# Patient Record
Sex: Female | Born: 1990 | Race: White | Hispanic: No | Marital: Married | Smoking: Former smoker
Health system: Southern US, Community
[De-identification: ages and names within clinical notes are randomized; demographics above are authoritative.]

## PROBLEM LIST (undated history)

## (undated) ENCOUNTER — Inpatient Hospital Stay (HOSPITAL_COMMUNITY): Payer: Self-pay

## (undated) DIAGNOSIS — Z8619 Personal history of other infectious and parasitic diseases: Secondary | ICD-10-CM

## (undated) DIAGNOSIS — F419 Anxiety disorder, unspecified: Secondary | ICD-10-CM

## (undated) HISTORY — DX: Personal history of other infectious and parasitic diseases: Z86.19

## (undated) HISTORY — DX: Anxiety disorder, unspecified: F41.9

## (undated) HISTORY — PX: NO PAST SURGERIES: SHX2092

---
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
## 2009-05-16 ENCOUNTER — Emergency Department (HOSPITAL_COMMUNITY): Admission: EM | Admit: 2009-05-16 | Discharge: 2009-05-16 | Payer: Self-pay | Admitting: Family Medicine

## 2011-01-05 ENCOUNTER — Inpatient Hospital Stay (INDEPENDENT_AMBULATORY_CARE_PROVIDER_SITE_OTHER)
Admission: RE | Admit: 2011-01-05 | Discharge: 2011-01-05 | Disposition: A | Discharge status: Home / Self Care | Payer: 59 | Source: Ambulatory Visit | Attending: Family Medicine | Admitting: Family Medicine

## 2011-01-05 DIAGNOSIS — L255 Unspecified contact dermatitis due to plants, except food: Secondary | ICD-10-CM

## 2011-01-14 ENCOUNTER — Ambulatory Visit: Payer: 59 | Admitting: Gynecology

## 2011-01-21 ENCOUNTER — Ambulatory Visit (INDEPENDENT_AMBULATORY_CARE_PROVIDER_SITE_OTHER): Payer: 59 | Admitting: Gynecology

## 2011-01-21 DIAGNOSIS — N898 Other specified noninflammatory disorders of vagina: Secondary | ICD-10-CM

## 2011-01-21 DIAGNOSIS — N949 Unspecified condition associated with female genital organs and menstrual cycle: Secondary | ICD-10-CM

## 2011-01-21 DIAGNOSIS — B373 Candidiasis of vulva and vagina: Secondary | ICD-10-CM

## 2011-11-23 ENCOUNTER — Emergency Department (HOSPITAL_COMMUNITY): Payer: 59

## 2011-11-23 ENCOUNTER — Emergency Department (HOSPITAL_COMMUNITY)
Admission: EM | Admit: 2011-11-23 | Discharge: 2011-11-23 | Disposition: A | Discharge status: Home / Self Care | Payer: 59 | Attending: Emergency Medicine | Admitting: Emergency Medicine

## 2011-11-23 ENCOUNTER — Encounter (HOSPITAL_COMMUNITY): Payer: Self-pay | Admitting: Emergency Medicine

## 2011-11-23 DIAGNOSIS — G8929 Other chronic pain: Secondary | ICD-10-CM

## 2011-11-23 DIAGNOSIS — R1110 Vomiting, unspecified: Secondary | ICD-10-CM | POA: Insufficient documentation

## 2011-11-23 DIAGNOSIS — R1033 Periumbilical pain: Secondary | ICD-10-CM | POA: Insufficient documentation

## 2011-11-23 LAB — URINALYSIS, ROUTINE W REFLEX MICROSCOPIC
Bilirubin Urine: NEGATIVE
Ketones, ur: NEGATIVE mg/dL
Nitrite: NEGATIVE
Specific Gravity, Urine: 1.009 (ref 1.005–1.030)
Urobilinogen, UA: 0.2 mg/dL (ref 0.0–1.0)

## 2011-11-23 LAB — CBC
HCT: 37.5 % (ref 36.0–46.0)
MCHC: 34.7 g/dL (ref 30.0–36.0)
RDW: 12.7 % (ref 11.5–15.5)

## 2011-11-23 LAB — POCT I-STAT, CHEM 8
Calcium, Ion: 1.32 mmol/L (ref 1.12–1.32)
Creatinine, Ser: 0.6 mg/dL (ref 0.50–1.10)
Glucose, Bld: 98 mg/dL (ref 70–99)
Hemoglobin: 13.6 g/dL (ref 12.0–15.0)
Sodium: 142 mEq/L (ref 135–145)
TCO2: 24 mmol/L (ref 0–100)

## 2011-11-23 LAB — DIFFERENTIAL
Basophils Absolute: 0 10*3/uL (ref 0.0–0.1)
Basophils Relative: 0 % (ref 0–1)
Monocytes Absolute: 0.5 10*3/uL (ref 0.1–1.0)
Neutro Abs: 12.5 10*3/uL — ABNORMAL HIGH (ref 1.7–7.7)

## 2011-11-23 MED ORDER — HYOSCYAMINE SULFATE 0.125 MG PO TABS
0.1250 mg | ORAL_TABLET | Freq: Once | ORAL | Status: AC
  Administered 2011-11-23: 0.125 mg via ORAL
  Filled 2011-11-23: qty 1

## 2011-11-23 MED ORDER — HYOSCYAMINE SULFATE 0.125 MG SL SUBL: 0.1250 mg | SUBLINGUAL_TABLET | SUBLINGUAL | Status: AC | PRN

## 2011-11-23 MED ORDER — IOHEXOL 300 MG/ML  SOLN
100.0000 mL | Freq: Once | INTRAMUSCULAR | Status: AC | PRN
  Administered 2011-11-23: 100 mL via INTRAVENOUS

## 2011-11-23 MED ORDER — ONDANSETRON HCL 4 MG/2ML IJ SOLN
INTRAMUSCULAR | Status: AC
  Administered 2011-11-23: 4 mg via INTRAVENOUS
  Filled 2011-11-23: qty 2

## 2011-11-23 MED ORDER — ONDANSETRON HCL 4 MG PO TABS: 4.0000 mg | ORAL_TABLET | Freq: Four times a day (QID) | ORAL | Status: AC

## 2011-11-23 MED ORDER — SODIUM CHLORIDE 0.9 % IV SOLN
Freq: Once | INTRAVENOUS | Status: AC
  Administered 2011-11-23: 19:00:00 via INTRAVENOUS

## 2011-11-23 MED ORDER — MORPHINE SULFATE 4 MG/ML IJ SOLN
INTRAMUSCULAR | Status: AC
  Administered 2011-11-23: 20:00:00
  Filled 2011-11-23: qty 1

## 2011-11-23 MED ORDER — IOHEXOL 300 MG/ML  SOLN
20.0000 mL | INTRAMUSCULAR | Status: AC
  Administered 2011-11-23 (×2): 20 mL via ORAL

## 2011-11-23 NOTE — Discharge Instructions (Signed)
YOUR CT SCAN IS COMPLETELY NORMAL TONIGHT. YOU CAN BE DISCHARGED HOME AND SHOULD FOLLOW UP WITH GASTROENTEROLOGY FOR FURTHER EVALUATION OF CHRONIC ABDOMINAL PAIN. TAKE MEDICATIONS AS PRESCRIBED. RETURN HERE WITH ANY HIGH FEVER, SEVERE PAIN, OR NEW CONCERN.  Abdominal Pain Abdominal pain can be caused by many things. Your caregiver decides the seriousness of your pain by an examination and possibly blood tests and X-rays. Many cases can be observed and treated at home. Most abdominal pain is not caused by a disease and will probably improve without treatment. However, in many cases, more time must pass before a clear cause of the pain can be found. Before that point, it may not be known if you need more testing, or if hospitalization or surgery is needed. HOME CARE INSTRUCTIONS   Do not take laxatives unless directed by your caregiver.   Take pain medicine only as directed by your caregiver.   Only take over-the-counter or prescription medicines for pain, discomfort, or fever as directed by your caregiver.   Try a clear liquid diet (broth, tea, or water) for as long as directed by your caregiver. Slowly move to a bland diet as tolerated.  SEEK IMMEDIATE MEDICAL CARE IF:   The pain does not go away.   You have a fever.   You keep throwing up (vomiting).   The pain is felt only in portions of the abdomen. Pain in the right side could possibly be appendicitis. In an adult, pain in the left lower portion of the abdomen could be colitis or diverticulitis.   You pass bloody or black tarry stools.  MAKE SURE YOU:   Understand these instructions.   Will watch your condition.   Will get help right away if you are not doing well or get worse.  Document Released: 06/04/2005 Document Revised: 08/14/2011 Document Reviewed: 04/12/2008 [facility] Patient Information 2012 [place], [place].

## 2011-11-23 NOTE — ED Notes (Signed)
Patient finished oral contrast, patient states she is nauseated and having pain again at this time.  PA notified.

## 2011-11-23 NOTE — ED Provider Notes (Signed)
History     CSN: [id number]  Arrival date & time 11/23/11  1509   First MD Initiated Contact with Patient 11/23/11 1653      Chief Complaint  Patient presents with  . Abdominal Pain  . Nausea  . Emesis    (Consider location/radiation/quality/duration/timing/severity/associated sxs/prior treatment) Patient is a 21 y.o. female presenting with abdominal pain. The history is provided by the patient.  Abdominal Pain The primary symptoms of the illness include abdominal pain and vomiting. The primary symptoms of the illness do not include fever, hematochezia, dysuria or vaginal discharge. The current episode started more than 2 days ago. The onset of the illness was gradual. The problem has been gradually worsening.  The abdominal pain is located in the periumbilical region. The abdominal pain does not radiate. The abdominal pain is relieved by nothing.  The patient has not had a change in bowel habit. Symptoms associated with the illness do not include chills. Associated symptoms comments: She reports she has had this same pain recurrently for the past 2 years, daily. The pain has been worse over the last one week. One week ago she had one day of diarrhea that resolved. The pain continued worse than her usual for the duration of the week. She had vomiting 2 days ago, then again today x 3. No fever. She reports she has not had symptoms associated with a gynecologic problem in the two years she has been experiencing the discomfort and reports normal GYN exams. She is currently menstruating that started today, normal time, that does not affect the pain of complaint.. Associated medical issues comments: There is no known family history of IBS, Crohn's, or UC.Marland Kitchen    History reviewed. No pertinent past medical history.  History reviewed. No pertinent past surgical history.  Family History  Problem Relation Age of Onset  . Hypertension Mother   . Hypertension Father   . Hypertension Brother      History  Substance Use Topics  . Smoking status: Never Smoker   . Smokeless tobacco: Not on file  . Alcohol Use: No    OB History    Grav Para Term Preterm Abortions TAB SAB Ect Mult Living                  Review of Systems  Constitutional: Negative for fever and chills.  HENT: Negative.   Respiratory: Negative.   Cardiovascular: Negative.   Gastrointestinal: Positive for vomiting and abdominal pain. Negative for hematochezia.  Genitourinary: Negative for dysuria and vaginal discharge.  Musculoskeletal: Negative.   Skin: Negative.   Neurological: Negative.     Allergies  Review of patient's allergies indicates no known allergies.  Home Medications  No current outpatient prescriptions on file.  BP 117/72  Pulse 72  Temp(Src) 98 F (36.7 C) (Oral)  Resp 18  SpO2 100%  LMP 11/23/2011  Physical Exam  Constitutional: She appears well-developed and well-nourished.  HENT:  Head: Normocephalic.  Neck: Normal range of motion. Neck supple.  Cardiovascular: Normal rate and regular rhythm.   Pulmonary/Chest: Effort normal and breath sounds normal.  Abdominal: Soft. Bowel sounds are normal. There is no rebound and no guarding.       Mild tenderness without rebound or guarding to inferior periumbilical area. No distention or swelling. BS + x 4.  Musculoskeletal: Normal range of motion.  Neurological: She is alert. No cranial nerve deficit.  Skin: Skin is warm and dry. No rash noted.  Psychiatric: She has a normal mood  and affect.    ED Course  Procedures (including critical care time) Results for orders placed during the hospital encounter of 11/23/11  CBC      Component Value Range   WBC 14.7 (*) 4.0 - 10.5 (K/uL)   RBC 4.25  3.87 - 5.11 (MIL/uL)   Hemoglobin 13.0  12.0 - 15.0 (g/dL)   HCT 16.1  09.6 - 04.5 (%)   MCV 88.2  78.0 - 100.0 (fL)   MCH 30.6  26.0 - 34.0 (pg)   MCHC 34.7  30.0 - 36.0 (g/dL)   RDW 40.9  81.1 - 91.4 (%)   Platelets 234  150 - 400  (K/uL)  DIFFERENTIAL      Component Value Range   Neutrophils Relative 85 (*) 43 - 77 (%)   Neutro Abs 12.5 (*) 1.7 - 7.7 (K/uL)   Lymphocytes Relative 10 (*) 12 - 46 (%)   Lymphs Abs 1.5  0.7 - 4.0 (K/uL)   Monocytes Relative 3  3 - 12 (%)   Monocytes Absolute 0.5  0.1 - 1.0 (K/uL)   Eosinophils Relative 1  0 - 5 (%)   Eosinophils Absolute 0.2  0.0 - 0.7 (K/uL)   Basophils Relative 0  0 - 1 (%)   Basophils Absolute 0.0  0.0 - 0.1 (K/uL)  URINALYSIS, ROUTINE W REFLEX MICROSCOPIC      Component Value Range   Color, Urine YELLOW  YELLOW    APPearance CLEAR  CLEAR    Specific Gravity, Urine 1.009  1.005 - 1.030    pH 5.5  5.0 - 8.0    Glucose, UA NEGATIVE  NEGATIVE (mg/dL)   Hgb urine dipstick MODERATE (*) NEGATIVE    Bilirubin Urine NEGATIVE  NEGATIVE    Ketones, ur NEGATIVE  NEGATIVE (mg/dL)   Protein, ur NEGATIVE  NEGATIVE (mg/dL)   Urobilinogen, UA 0.2  0.0 - 1.0 (mg/dL)   Nitrite NEGATIVE  NEGATIVE    Leukocytes, UA NEGATIVE  NEGATIVE   PREGNANCY, URINE      Component Value Range   Preg Test, Ur NEGATIVE  NEGATIVE   POCT I-STAT, CHEM 8      Component Value Range   Sodium 142  135 - 145 (mEq/L)   Potassium 3.5  3.5 - 5.1 (mEq/L)   Chloride 105  96 - 112 (mEq/L)   BUN 7  6 - 23 (mg/dL)   Creatinine, Ser 7.82  0.50 - 1.10 (mg/dL)   Glucose, Bld 98  70 - 99 (mg/dL)   Calcium, Ion 9.56  2.13 - 1.32 (mmol/L)   TCO2 24  0 - 100 (mmol/L)   Hemoglobin 13.6  12.0 - 15.0 (g/dL)   HCT 08.6  57.8 - 46.9 (%)  URINE MICROSCOPIC-ADD ON      Component Value Range   Squamous Epithelial / LPF RARE  RARE    RBC / HPF 0-2  <3 (RBC/hpf)   Ct Abdomen Pelvis W Contrast  11/23/2011  *RADIOLOGY REPORT*  Clinical Data: Abdominal pain.  Nausea and emesis  CT ABDOMEN AND PELVIS WITH CONTRAST  Technique:  Multidetector CT imaging of the abdomen and pelvis was performed following the standard protocol during bolus administration of intravenous contrast.  Contrast: OMNIPAQUE IOHEXOL 300 MG/ML  IJ SOLN  Comparison: None.  Findings: The lung bases appear clear.  No pericardial or pleural effusion.  No focal liver abnormalities.  The spleen is normal.  Normal appearance of the adrenal glands.  Gallbladder is negative.  No biliary dilatation.  The pancreas is normal.  Normal appearance of both kidneys.  No upper abdominal adenopathy.  There is no pelvic or inguinal adenopathy.  Urinary bladder is normal.  The uterus and adnexal structures are negative.  The stomach and the small bowel loops appear normal.  The appendix is identified and appears normal.  The colon appears normal.  Review of the visualized osseous structures is negative.  IMPRESSION:  1.  No acute findings identified.  Original Report Authenticated By: Rosealee Albee, M.D.      1. Abdominal pain   2. Chronic pain       MDM  Patient has been stable and comfortable. CT neg. Will refer to GI for further      Medical screening examination/treatment/procedure(s) were performed by non-physician practitioner and as supervising physician I was immediately available for consultation/collaboration. Osvaldo Human, M.D.    Rodena Medin, PA-C 11/23/11 2136  Carleene Cooper III, MD 11/25/11 1115

## 2011-11-23 NOTE — ED Notes (Signed)
Ct notified of patient finishing oral contrast.

## 2011-11-23 NOTE — ED Notes (Signed)
Pt reports abdominal pain with N/V onset Saturday.

## 2012-03-02 ENCOUNTER — Inpatient Hospital Stay (HOSPITAL_COMMUNITY): Payer: 59

## 2012-03-02 ENCOUNTER — Inpatient Hospital Stay (HOSPITAL_COMMUNITY)
Admission: AD | Admit: 2012-03-02 | Discharge: 2012-03-02 | Disposition: A | Discharge status: Home / Self Care | Payer: 59 | Source: Ambulatory Visit | Attending: Obstetrics and Gynecology | Admitting: Obstetrics and Gynecology

## 2012-03-02 ENCOUNTER — Encounter (HOSPITAL_COMMUNITY): Payer: Self-pay | Admitting: *Deleted

## 2012-03-02 DIAGNOSIS — Z1389 Encounter for screening for other disorder: Secondary | ICD-10-CM

## 2012-03-02 DIAGNOSIS — O26899 Other specified pregnancy related conditions, unspecified trimester: Secondary | ICD-10-CM

## 2012-03-02 DIAGNOSIS — O99891 Other specified diseases and conditions complicating pregnancy: Secondary | ICD-10-CM | POA: Insufficient documentation

## 2012-03-02 DIAGNOSIS — R1033 Periumbilical pain: Secondary | ICD-10-CM | POA: Insufficient documentation

## 2012-03-02 DIAGNOSIS — Z3490 Encounter for supervision of normal pregnancy, unspecified, unspecified trimester: Secondary | ICD-10-CM

## 2012-03-02 LAB — WET PREP, GENITAL
Trich, Wet Prep: NONE SEEN
Yeast Wet Prep HPF POC: NONE SEEN

## 2012-03-02 LAB — URINALYSIS, ROUTINE W REFLEX MICROSCOPIC
Ketones, ur: NEGATIVE mg/dL
Leukocytes, UA: NEGATIVE
Nitrite: NEGATIVE
Protein, ur: NEGATIVE mg/dL

## 2012-03-02 NOTE — MAU Provider Note (Signed)
History     CSN: [id number]  Arrival date and time: 03/02/12 0544   First Provider Initiated Contact with Patient 03/02/12 [phone]      No chief complaint on file.  HPI This is a 21 y.o. female at 35-[redacted] weeks gestation by LMP who presents with c/o onset of pain just under umbilicus this morning. Denies leaking or bleeding. Has never had this pain before. Has nausea but denies vomiting, diarrhea or fever. Does have some constipation.   RN Note: PT SAYS SHE WENT TO A DR OFFICE ON 5-28- DOESN'T KNOW WHICH ONE- WHO DID A UPT- TOLD HER POSTIVE. HAS MID- ABD PAIN- THAT STARTED AT 0400- LAST SEX- 6-23- NO PAIN BEFORE. DID NOT TAKE ANY MED FOR PAIN    OB History    Grav Para Term Preterm Abortions TAB SAB Ect Mult Living   1               History reviewed. No pertinent past medical history.  History reviewed. No pertinent past surgical history.  Family History  Problem Relation Age of Onset  . Hypertension Mother   . Hypertension Father   . Hypertension Brother     History  Substance Use Topics  . Smoking status: Former Games developer  . Smokeless tobacco: Not on file  . Alcohol Use: No    Allergies: No Known Allergies  Prescriptions prior to admission  Medication Sig Dispense Refill  . Multiple Vitamin (MULTIVITAMIN) tablet Take 1 tablet by mouth daily.        ROS As listed in HPI  Physical Exam   Blood pressure 111/62, pulse 82, temperature 99.3 F (37.4 C), temperature source Oral, resp. rate 18, height 5\' 3"  (1.6 m), weight 137 lb 8 oz (62.37 kg), last menstrual period 12/19/2011.  Physical Exam  Constitutional: She is oriented to person, place, and time. She appears well-developed and well-nourished. No distress.  Cardiovascular: Normal rate.   Respiratory: Effort normal.  GI: Soft. She exhibits no distension and no mass. There is tenderness. There is no rebound and no guarding.       Slightly tender under umbilicus. No blood in vault. Cervix long and closed    Genitourinary: Vagina normal and uterus normal. No vaginal discharge found.  Musculoskeletal: Normal range of motion.  Neurological: She is alert and oriented to person, place, and time.  Skin: Skin is warm and dry.  Psychiatric: She has a normal mood and affect.   Results for orders placed during the hospital encounter of 03/02/12 (from the past 24 hour(s))  POCT PREGNANCY, URINE     Status: Abnormal   Collection Time   03/02/12  6:18 AM      Component Value Range   Preg Test, Ur POSITIVE (*) NEGATIVE  URINALYSIS, ROUTINE W REFLEX MICROSCOPIC     Status: Abnormal   Collection Time   03/02/12  6:31 AM      Component Value Range   Color, Urine YELLOW  YELLOW   APPearance CLEAR  CLEAR   Specific Gravity, Urine >1.030 (*) 1.005 - 1.030   pH 6.0  5.0 - 8.0   Glucose, UA NEGATIVE  NEGATIVE mg/dL   Hgb urine dipstick NEGATIVE  NEGATIVE   Bilirubin Urine NEGATIVE  NEGATIVE   Ketones, ur NEGATIVE  NEGATIVE mg/dL   Protein, ur NEGATIVE  NEGATIVE mg/dL   Urobilinogen, UA 0.2  0.0 - 1.0 mg/dL   Nitrite NEGATIVE  NEGATIVE   Leukocytes, UA NEGATIVE  NEGATIVE  WET PREP, GENITAL  Status: Abnormal   Collection Time   03/02/12  6:52 AM      Component Value Range   Yeast Wet Prep HPF POC NONE SEEN  NONE SEEN   Trich, Wet Prep NONE SEEN  NONE SEEN   Clue Cells Wet Prep HPF POC FEW (*) NONE SEEN   WBC, Wet Prep HPF POC MODERATE (*) NONE SEEN    MAU Course  Procedures  MDM GC/Chl/Wet prep done. Will check [place] to confirm IUP.  Assessment and Plan  Report to oncoming NP  [facility] 03/02/2012, 6:54 AM   Ultrasound results show a 10 week 4 day IUP, normal ovaries, no adnexal mass  Assessment: 21 y.o. with viable IUP @ 10.[redacted] weeks gestation   Abdominal pain in pregnancy resolved prior to d/c  Plan:  Start prenatal care, pregnancy verification letter given   Return as needed.

## 2012-03-02 NOTE — MAU Note (Signed)
PT SAYS SHE WENT TO A DR OFFICE ON 5-28- DOESN'T KNOW WHICH ONE- WHO DID A UPT- TOLD HER  POSTIVE.  HAS MID- ABD PAIN- THAT STARTED AT  0400-  LAST SEX-  6-23-   NO PAIN BEFORE.  DID NOT TAKE ANY MED FOR PAIN.

## 2012-03-02 NOTE — Discharge Instructions (Signed)
________________________________________     To schedule your Maternity Eligibility Appointment, please call [phone].  When you arrive for your appointment you must bring the following items or information listed below.  Your appointment will be rescheduled if you do not have these items or are 15 minutes late. If currently receiving Medicaid, you MUST bring: 1. Medicaid Card 2. Social Security Card 3. Picture ID 4. Proof of Pregnancy 5. Verification of current address if the address on Medicaid card is incorrect "postmarked mail" If not receiving Medicaid, you MUST bring: 1. Social Security Card 2. Picture ID 3. Birth Certificate (if available) Passport or *Green Card 4. Proof of Pregnancy 5. Verification of current address "postmarked mail" for each income presented. 6. Verification of insurance coverage, if any 7. Check stubs from each employer for the previous month (if unable to present check stub  for each week, we will accept check stub for the first and last week ill the same month.) If you can't locate check stubs, you must bring a letter from the employer(s) and it must have the following information on letterhead, typed, in English: o name of company o company telephone number o how long been with the company, if less than one month o how much person earns per hour o how many hours per week work o the gross pay the person earned for the previous month If you are 22 years old or less, you do not have to bring proof of income unless you work or live with the father of the baby and at that time we will need proof of income from you and/or the father of the baby. Green Card recipients are eligible for Medicaid for Pregnant Women (MPW)    Abdominal Pain During Pregnancy Abdominal discomfort is common in pregnancy. Most of the time, it does not cause harm. There are many causes of abdominal pain. Some causes are more serious than others. Some of the causes of abdominal  pain in pregnancy are easily diagnosed. Occasionally, the diagnosis takes time to understand. Other times, the cause is not determined. Abdominal pain can be a sign that something is very wrong with the pregnancy, or the pain may have nothing to do with the pregnancy at all. For this reason, always tell your caregiver if you have any abdominal discomfort. CAUSES Common and harmless causes of abdominal pain include:  Constipation.   Excess gas and bloating.   Round ligament pain. This is pain that is felt in the folds of the groin.   The position the baby or placenta is in.   Baby kicks.   Braxton-Hicks contractions. These are mild contractions that do not cause cervical dilation.  Serious causes of abdominal pain include:  Ectopic pregnancy. This happens when a fertilized egg implants outside of the uterus.   Miscarriage.   Preterm labor. This is when labor starts at less than 37 weeks of pregnancy.   Placental abruption. This is when the placenta partially or completely separates from the uterus.   Preeclampsia. This is often associated with high blood pressure and has been referred to as "toxemia in pregnancy."   Uterine or amniotic fluid infections.  Causes unrelated to pregnancy include:  Urinary tract infection.   Gallbladder stones or inflammation.   Hepatitis or other liver illness.   Intestinal problems, stomach flu, food poisoning, or ulcer.   Appendicitis.   Kidney (renal) stones.   Kidney infection (pylonephritis).  HOME CARE INSTRUCTIONS  For mild pain:  Do not  have sexual intercourse or put anything in your vagina until your symptoms go away completely.   Get plenty of rest until your pain improves. If your pain does not improve in 1 hour, call your caregiver.   Drink clear fluids if you feel nauseous. Avoid solid food as long as you are uncomfortable or nauseous.   Only take medicine as directed by your caregiver.   Keep all follow-up appointments  with your caregiver.  SEEK IMMEDIATE MEDICAL CARE IF:  You are bleeding, leaking fluid, or passing tissue from the vagina.   You have increasing pain or cramping.   You have persistent vomiting.   You have painful or bloody urination.   You have a fever.   You notice a decrease in your baby's movements.   You have extreme weakness or feel faint.   You have shortness of breath, with or without abdominal pain.   You develop a severe headache with abdominal pain.   You have abnormal vaginal discharge with abdominal pain.   You have persistent diarrhea.   You have abdominal pain that continues even after rest, or gets worse.  MAKE SURE YOU:   Understand these instructions.   Will watch your condition.   Will get help right away if you are not doing well or get worse.  Document Released: 08/25/2005 Document Revised: 08/14/2011 Document Reviewed: 03/21/2011 [facility] Patient Information 2012 [place], [place].Abdominal Pain During Pregnancy Abdominal discomfort is common in pregnancy. Most of the time, it does not cause harm. There are many causes of abdominal pain. Some causes are more serious than others. Some of the causes of abdominal pain in pregnancy are easily diagnosed. Occasionally, the diagnosis takes time to understand. Other times, the cause is not determined. Abdominal pain can be a sign that something is very wrong with the pregnancy, or the pain may have nothing to do with the pregnancy at all. For this reason, always tell your caregiver if you have any abdominal discomfort. CAUSES Common and harmless causes of abdominal pain include:  Constipation.   Excess gas and bloating.   Round ligament pain. This is pain that is felt in the folds of the groin.   The position the baby or placenta is in.   Baby kicks.   Braxton-Hicks contractions. These are mild contractions that do not cause cervical dilation.  Serious causes of abdominal pain include:  Ectopic  pregnancy. This happens when a fertilized egg implants outside of the uterus.   Miscarriage.   Preterm labor. This is when labor starts at less than 37 weeks of pregnancy.   Placental abruption. This is when the placenta partially or completely separates from the uterus.   Preeclampsia. This is often associated with high blood pressure and has been referred to as "toxemia in pregnancy."   Uterine or amniotic fluid infections.  Causes unrelated to pregnancy include:  Urinary tract infection.   Gallbladder stones or inflammation.   Hepatitis or other liver illness.   Intestinal problems, stomach flu, food poisoning, or ulcer.   Appendicitis.   Kidney (renal) stones.   Kidney infection (pylonephritis).  HOME CARE INSTRUCTIONS  For mild pain:  Do not have sexual intercourse or put anything in your vagina until your symptoms go away completely.   Get plenty of rest until your pain improves. If your pain does not improve in 1 hour, call your caregiver.   Drink clear fluids if you feel nauseous. Avoid solid food as long as you are uncomfortable or nauseous.  Only take medicine as directed by your caregiver.   Keep all follow-up appointments with your caregiver.  SEEK IMMEDIATE MEDICAL CARE IF:  You are bleeding, leaking fluid, or passing tissue from the vagina.   You have increasing pain or cramping.   You have persistent vomiting.   You have painful or bloody urination.   You have a fever.   You notice a decrease in your baby's movements.   You have extreme weakness or feel faint.   You have shortness of breath, with or without abdominal pain.   You develop a severe headache with abdominal pain.   You have abnormal vaginal discharge with abdominal pain.   You have persistent diarrhea.   You have abdominal pain that continues even after rest, or gets worse.  MAKE SURE YOU:   Understand these instructions.   Will watch your condition.   Will get help  right away if you are not doing well or get worse.  Document Released: 08/25/2005 Document Revised: 08/14/2011 Document Reviewed: 03/21/2011 [facility] Patient Information 2012 [place], [place].

## 2012-03-03 NOTE — MAU Provider Note (Signed)
Agree with above note.  Shae Hinnenkamp 03/03/2012 8:41 AM

## 2012-03-22 ENCOUNTER — Inpatient Hospital Stay (HOSPITAL_COMMUNITY)
Admission: AD | Admit: 2012-03-22 | Discharge: 2012-03-23 | Disposition: A | Discharge status: Home / Self Care | Payer: Medicaid Other | Source: Ambulatory Visit | Attending: Obstetrics & Gynecology | Admitting: Obstetrics & Gynecology

## 2012-03-22 ENCOUNTER — Encounter (HOSPITAL_COMMUNITY): Payer: Self-pay | Admitting: *Deleted

## 2012-03-22 DIAGNOSIS — R51 Headache: Secondary | ICD-10-CM

## 2012-03-22 DIAGNOSIS — O99891 Other specified diseases and conditions complicating pregnancy: Secondary | ICD-10-CM | POA: Insufficient documentation

## 2012-03-22 DIAGNOSIS — R509 Fever, unspecified: Secondary | ICD-10-CM | POA: Insufficient documentation

## 2012-03-22 DIAGNOSIS — O26899 Other specified pregnancy related conditions, unspecified trimester: Secondary | ICD-10-CM

## 2012-03-22 LAB — URINALYSIS, ROUTINE W REFLEX MICROSCOPIC
Bilirubin Urine: NEGATIVE
Hgb urine dipstick: NEGATIVE
Ketones, ur: NEGATIVE mg/dL
Nitrite: NEGATIVE
Urobilinogen, UA: 0.2 mg/dL (ref 0.0–1.0)
pH: 6 (ref 5.0–8.0)

## 2012-03-22 MED ORDER — ONDANSETRON HCL 4 MG/2ML IJ SOLN
4.0000 mg | Freq: Once | INTRAMUSCULAR | Status: AC
  Administered 2012-03-22: 4 mg via INTRAVENOUS
  Filled 2012-03-22: qty 2

## 2012-03-22 MED ORDER — BUTALBITAL-APAP-CAFFEINE 50-325-40 MG PO TABS
2.0000 | ORAL_TABLET | Freq: Once | ORAL | Status: AC
  Administered 2012-03-22: 2 via ORAL
  Filled 2012-03-22: qty 2

## 2012-03-22 MED ORDER — LACTATED RINGERS IV BOLUS (SEPSIS)
1000.0000 mL | Freq: Once | INTRAVENOUS | Status: AC
  Administered 2012-03-22: 1000 mL via INTRAVENOUS

## 2012-03-22 MED ORDER — DIPHENHYDRAMINE HCL 50 MG/ML IJ SOLN
12.5000 mg | Freq: Once | INTRAMUSCULAR | Status: AC
  Administered 2012-03-22: 12.5 mg via INTRAVENOUS
  Filled 2012-03-22: qty 1

## 2012-03-22 NOTE — MAU Provider Note (Signed)
History     CSN: [id number]  Arrival date and time: 03/22/12 2011   First Provider Initiated Contact with Patient 03/22/12 2056      Chief Complaint  Patient presents with  . Headache   HPI This is a 21 y.o. female at [redacted]w[redacted]d who presents with headache and dizziness. Has had headaches before which usually go away with time. Never takes meds for them. This one did not go away, even though she did not take anything. Started feeling dizzy and came here. Has not eaten in 5 hours. Does not eat regularly. Takes zofran for nausea (got Rx before pregnancy) OB History    Grav Para Term Preterm Abortions TAB SAB Ect Mult Living   1               History reviewed. No pertinent past medical history.  History reviewed. No pertinent past surgical history.  Family History  Problem Relation Age of Onset  . Hypertension Mother   . Hypertension Father   . Hypertension Brother   . Other Neg Hx     History  Substance Use Topics  . Smoking status: Former Games developer  . Smokeless tobacco: Not on file  . Alcohol Use: No    Allergies: No Known Allergies  Prescriptions prior to admission  Medication Sig Dispense Refill  . Multiple Vitamin (MULTIVITAMIN) tablet Take 1 tablet by mouth daily.        ROS As in HPI  Physical Exam   Blood pressure 112/68, pulse 77, temperature 99.4 F (37.4 C), temperature source Oral, resp. rate 18, height 5' 3.5" (1.613 m), weight 134 lb (60.782 kg), last menstrual period 12/19/2011, SpO2 100.00%.  Physical Exam  Constitutional: She is oriented to person, place, and time. She appears well-developed and well-nourished. No distress.  HENT:  Head: Normocephalic.  Cardiovascular: Normal rate.   Respiratory: Effort normal.  GI: Soft. She exhibits no distension. There is no tenderness.  Musculoskeletal: Normal range of motion.  Neurological: She is alert and oriented to person, place, and time. No cranial nerve deficit. She exhibits normal muscle tone.  Coordination normal.  Skin: Skin is warm and dry.  Psychiatric: She has a normal mood and affect.   Neuro: grossly intact  MAU Course  Procedures  MDM Will give fioricet for headache. Encouraged hydration and regular food intake, at least every 4 hours. Headaches may be related to low blood sugar.  Assessment and Plan    Shanae Luo 03/22/2012, 11:56 PM  21:30 pm Assumed care from Wynelle Bourgeois, CNM @ 21:00 Patient continues to have headache after Fioricet. Rates her pain as 7/10. The pain is located in her forehead and the back of her head. It is a throbbing pain. It is worse with lights and noise.  Re examined patient. She is alert and oriented x 3 and in no acute distress. She is currently texting on her phone. Pupils are equal and react to light. No nystagmus. Good occular movement and peripheral vision. Good grips and equal bilateral. Steady gait. Pulses equal and strong upper and lower extremities, reflexes 3+ and equal. Rapid alternating movements without difficulty. Negative Romberg.   Results for orders placed during the hospital encounter of 03/22/12 (from the past 24 hour(s))  URINALYSIS, ROUTINE W REFLEX MICROSCOPIC     Status: Abnormal   Collection Time   03/22/12  8:20 PM      Component Value Range   Color, Urine YELLOW  YELLOW   APPearance CLEAR  CLEAR   Specific  Gravity, Urine >1.030 (*) 1.005 - 1.030   pH 6.0  5.0 - 8.0   Glucose, UA NEGATIVE  NEGATIVE mg/dL   Hgb urine dipstick NEGATIVE  NEGATIVE   Bilirubin Urine NEGATIVE  NEGATIVE   Ketones, ur NEGATIVE  NEGATIVE mg/dL   Protein, ur NEGATIVE  NEGATIVE mg/dL   Urobilinogen, UA 0.2  0.0 - 1.0 mg/dL   Nitrite NEGATIVE  NEGATIVE   Leukocytes, UA NEGATIVE  NEGATIVE     BP 112/68  Pulse 77  Temp 99.4 F (37.4 C) (Oral)  Resp 18  Ht 5' 3.5" (1.613 m)  Wt 134 lb (60.782 kg)  BMI 23.36 kg/m2  SpO2 100%  LMP 12/19/2011 she is not orthostatic  Assessment: Headache in pregnancy  Plan:  IV hydration, Zofran 4  mg IV, Benadryl 12.5 mg IV  23:50 pm Patient feeling better, headache 4/10. States she feels like she can go home and sleep. Husband at bedside. Patient eating and drinking without difficulty.   She will continue Zofran at home, take tylenol as needed for headache and start her prenatal care. She will return here as needed.  I have reviewed this patient's vital signs, nurses notes and appropriate labs. I have discussed plan of care with the patient and she voices understanding.

## 2012-03-22 NOTE — MAU Note (Signed)
Pt reports frequent headaches since positive preg test, has not been evaluated for these. States today the headache will not go away, has not taken meds for pain. Nausea today. LMP 12/19/2011

## 2012-03-22 NOTE — MAU Note (Signed)
Pt reports a h/a since today. Pt also reports feeling nauseated and lightheaded.

## 2012-04-23 LAB — OB RESULTS CONSOLE ANTIBODY SCREEN: Antibody Screen: NEGATIVE

## 2012-04-23 LAB — OB RESULTS CONSOLE RPR: RPR: NONREACTIVE

## 2012-04-23 LAB — OB RESULTS CONSOLE ABO/RH: RH Type: POSITIVE

## 2012-06-16 ENCOUNTER — Encounter (HOSPITAL_COMMUNITY): Payer: Self-pay

## 2012-06-16 ENCOUNTER — Inpatient Hospital Stay (HOSPITAL_COMMUNITY)
Admission: AD | Admit: 2012-06-16 | Discharge: 2012-06-17 | Disposition: A | Discharge status: Home / Self Care | Payer: Medicaid Other | Source: Ambulatory Visit | Attending: Obstetrics & Gynecology | Admitting: Obstetrics & Gynecology

## 2012-06-16 DIAGNOSIS — O368190 Decreased fetal movements, unspecified trimester, not applicable or unspecified: Secondary | ICD-10-CM | POA: Insufficient documentation

## 2012-06-16 LAB — URINALYSIS, ROUTINE W REFLEX MICROSCOPIC
Glucose, UA: NEGATIVE mg/dL
Hgb urine dipstick: NEGATIVE
Leukocytes, UA: NEGATIVE
Protein, ur: NEGATIVE mg/dL
Specific Gravity, Urine: 1.015 (ref 1.005–1.030)
pH: 7 (ref 5.0–8.0)

## 2012-06-16 NOTE — Discharge Instructions (Signed)
Fetal Movement Counts Patient Name: __________________________________________________ Patient Due Date: ____________________ Kick counts is highly recommended in high risk pregnancies, but it is a good idea for every pregnant woman to do. Start counting fetal movements at 28 weeks of the pregnancy. Fetal movements increase after eating a full meal or eating or drinking something sweet (the blood sugar is higher). It is also important to drink plenty of fluids (well hydrated) before doing the count. Lie on your left side because it helps with the circulation or you can sit in a comfortable chair with your arms over your belly (abdomen) with no distractions around you. DOING THE COUNT  Try to do the count the same time of day each time you do it.  Mark the day and time, then see how long it takes for you to feel 10 movements (kicks, flutters, swishes, rolls). You should have at least 10 movements within 2 hours. You will most likely feel 10 movements in much less than 2 hours. If you do not, wait an hour and count again. After a couple of days you will see a pattern.  What you are looking for is a change in the pattern or not enough counts in 2 hours. Is it taking longer in time to reach 10 movements? SEEK MEDICAL CARE IF:  You feel less than 10 counts in 2 hours. Tried twice.  No movement in one hour.  The pattern is changing or taking longer each day to reach 10 counts in 2 hours.  You feel the baby is not moving as it usually does. Date: ____________ Movements: ____________ Start time: ____________ Finish time: ____________  Date: ____________ Movements: ____________ Start time: ____________ Finish time: ____________ Date: ____________ Movements: ____________ Start time: ____________ Finish time: ____________ Date: ____________ Movements: ____________ Start time: ____________ Finish time: ____________ Date: ____________ Movements: ____________ Start time: ____________ Finish time:  ____________ Date: ____________ Movements: ____________ Start time: ____________ Finish time: ____________ Date: ____________ Movements: ____________ Start time: ____________ Finish time: ____________ Date: ____________ Movements: ____________ Start time: ____________ Finish time: ____________  Date: ____________ Movements: ____________ Start time: ____________ Finish time: ____________ Date: ____________ Movements: ____________ Start time: ____________ Finish time: ____________ Date: ____________ Movements: ____________ Start time: ____________ Finish time: ____________ Date: ____________ Movements: ____________ Start time: ____________ Finish time: ____________ Date: ____________ Movements: ____________ Start time: ____________ Finish time: ____________ Date: ____________ Movements: ____________ Start time: ____________ Finish time: ____________ Date: ____________ Movements: ____________ Start time: ____________ Finish time: ____________  Date: ____________ Movements: ____________ Start time: ____________ Finish time: ____________ Date: ____________ Movements: ____________ Start time: ____________ Finish time: ____________ Date: ____________ Movements: ____________ Start time: ____________ Finish time: ____________ Date: ____________ Movements: ____________ Start time: ____________ Finish time: ____________ Date: ____________ Movements: ____________ Start time: ____________ Finish time: ____________ Date: ____________ Movements: ____________ Start time: ____________ Finish time: ____________ Date: ____________ Movements: ____________ Start time: ____________ Finish time: ____________  Date: ____________ Movements: ____________ Start time: ____________ Finish time: ____________ Date: ____________ Movements: ____________ Start time: ____________ Finish time: ____________ Date: ____________ Movements: ____________ Start time: ____________ Finish time: ____________ Date: ____________ Movements:  ____________ Start time: ____________ Finish time: ____________ Date: ____________ Movements: ____________ Start time: ____________ Finish time: ____________ Date: ____________ Movements: ____________ Start time: ____________ Finish time: ____________ Date: ____________ Movements: ____________ Start time: ____________ Finish time: ____________  Date: ____________ Movements: ____________ Start time: ____________ Finish time: ____________ Date: ____________ Movements: ____________ Start time: ____________ Finish time: ____________ Date: ____________ Movements: ____________ Start time: ____________ Finish time: ____________ Date: ____________ Movements:   ____________ Start time: ____________ Finish time: ____________ Date: ____________ Movements: ____________ Start time: ____________ Finish time: ____________ Date: ____________ Movements: ____________ Start time: ____________ Finish time: ____________ Date: ____________ Movements: ____________ Start time: ____________ Finish time: ____________  Date: ____________ Movements: ____________ Start time: ____________ Finish time: ____________ Date: ____________ Movements: ____________ Start time: ____________ Finish time: ____________ Date: ____________ Movements: ____________ Start time: ____________ Finish time: ____________ Date: ____________ Movements: ____________ Start time: ____________ Finish time: ____________ Date: ____________ Movements: ____________ Start time: ____________ Finish time: ____________ Date: ____________ Movements: ____________ Start time: ____________ Finish time: ____________ Date: ____________ Movements: ____________ Start time: ____________ Finish time: ____________  Date: ____________ Movements: ____________ Start time: ____________ Finish time: ____________ Date: ____________ Movements: ____________ Start time: ____________ Finish time: ____________ Date: ____________ Movements: ____________ Start time: ____________ Finish  time: ____________ Date: ____________ Movements: ____________ Start time: ____________ Finish time: ____________ Date: ____________ Movements: ____________ Start time: ____________ Finish time: ____________ Date: ____________ Movements: ____________ Start time: ____________ Finish time: ____________ Date: ____________ Movements: ____________ Start time: ____________ Finish time: ____________  Date: ____________ Movements: ____________ Start time: ____________ Finish time: ____________ Date: ____________ Movements: ____________ Start time: ____________ Finish time: ____________ Date: ____________ Movements: ____________ Start time: ____________ Finish time: ____________ Date: ____________ Movements: ____________ Start time: ____________ Finish time: ____________ Date: ____________ Movements: ____________ Start time: ____________ Finish time: ____________ Date: ____________ Movements: ____________ Start time: ____________ Finish time: ____________ Document Released: 09/24/2006 Document Revised: 11/17/2011 Document Reviewed: 03/27/2009 [employer] Patient Information 2013 [employer].  

## 2012-06-16 NOTE — MAU Provider Note (Signed)
  History     CSN: [id number]  Arrival date and time: 06/16/12 2229   First Provider Initiated Contact with Patient 06/16/12 2311      Chief Complaint  Patient presents with  . Decreased Fetal Movement   HPI Janet Love is 21 y.o. G1P0 [redacted]w[redacted]d weeks presenting with report of decreased fetal movement since yesterday afternoon.  Patient of [facility]  Denies vaginal bleeding or loss of fluid.  Reports intermittent upper/rib pain since yesterday.  Hasn't taken anything for it.  Patient is alert and oriented and in no acute distress.      Past Medical History  Diagnosis Date  . No pertinent past medical history     Past Surgical History  Procedure Date  . No past surgeries     Family History  Problem Relation Age of Onset  . Hypertension Mother   . Hypertension Father   . Hypertension Brother   . Other Neg Hx     History  Substance Use Topics  . Smoking status: Former Games developer  . Smokeless tobacco: Not on file  . Alcohol Use: No    Allergies: No Known Allergies  Prescriptions prior to admission  Medication Sig Dispense Refill  . Multiple Vitamin (MULTIVITAMIN) tablet Take 1 tablet by mouth daily.        Review of Systems  Constitutional: Negative.   Gastrointestinal: Positive for abdominal pain (upper abdominal pain in her ribs).  Genitourinary:       Decreased fetal movement.   Physical Exam   Blood pressure 119/78, pulse 97, temperature 98.1 F (36.7 C), temperature source Oral, resp. rate 20, height 5' 2.5" (1.588 m), weight 72.576 kg (160 lb), last menstrual period 12/19/2011, SpO2 100.00%.  Physical Exam  Constitutional: She is oriented to person, place, and time. She appears well-developed and well-nourished. No distress.  Neurological: She is alert and oriented to person, place, and time.  Skin: Skin is warm and dry.  Psychiatric: She has a normal mood and affect. Her behavior is normal.   Results for orders placed during the hospital encounter  of 06/16/12 (from the past 24 hour(s))  URINALYSIS, ROUTINE W REFLEX MICROSCOPIC     Status: Normal   Collection Time   06/16/12 10:45 PM      Component Value Range   Color, Urine YELLOW  YELLOW   APPearance CLEAR  CLEAR   Specific Gravity, Urine 1.015  1.005 - 1.030   pH 7.0  5.0 - 8.0   Glucose, UA NEGATIVE  NEGATIVE mg/dL   Hgb urine dipstick NEGATIVE  NEGATIVE   Bilirubin Urine NEGATIVE  NEGATIVE   Ketones, ur NEGATIVE  NEGATIVE mg/dL   Protein, ur NEGATIVE  NEGATIVE mg/dL   Urobilinogen, UA 0.2  0.0 - 1.0 mg/dL   Nitrite NEGATIVE  NEGATIVE   Leukocytes, UA NEGATIVE  NEGATIVE     MAU Course  Procedures  MDM FMS reassuring.  Baseline rate is 140, moderate variability, 10X10, no contractions.  Felt baby move X 3 in 20 minutes 11:45  Reported MSE, UA and FMS findings to Dr. Arlyce Dice.  Order given to discharge to home and follow up as planned.  Assessment and Plan  A:  Decreased fetal movment       Viable intrauterine pregnancy at [redacted]w[redacted]d gestation  P:  Reassured      Keep scheduled appointment  Janet Love,EVE M 06/16/2012, 11:13 PM

## 2012-06-16 NOTE — MAU Note (Signed)
Pt states she has not felt the baby move all day-since 4pm yesterday

## 2012-07-07 ENCOUNTER — Inpatient Hospital Stay (HOSPITAL_COMMUNITY)
Admission: AD | Admit: 2012-07-07 | Discharge: 2012-07-07 | Disposition: A | Discharge status: Home / Self Care | Payer: Medicaid Other | Source: Ambulatory Visit | Attending: Obstetrics and Gynecology | Admitting: Obstetrics and Gynecology

## 2012-07-07 ENCOUNTER — Encounter (HOSPITAL_COMMUNITY): Payer: Self-pay | Admitting: *Deleted

## 2012-07-07 DIAGNOSIS — O4700 False labor before 37 completed weeks of gestation, unspecified trimester: Secondary | ICD-10-CM | POA: Insufficient documentation

## 2012-07-07 LAB — URINALYSIS, ROUTINE W REFLEX MICROSCOPIC
Glucose, UA: NEGATIVE mg/dL
Ketones, ur: NEGATIVE mg/dL
Leukocytes, UA: NEGATIVE
Nitrite: NEGATIVE
pH: 7 (ref 5.0–8.0)

## 2012-07-07 LAB — FETAL FIBRONECTIN: Fetal Fibronectin: NEGATIVE

## 2012-07-07 NOTE — MAU Provider Note (Signed)
History     CSN: [id number]  Arrival date and time: 07/07/12 1644   First Provider Initiated Contact with Patient 07/07/12 1755      Chief Complaint  Patient presents with  . Labor Eval   HPI Janet Love 21 y.o. [redacted]w[redacted]d Comes to MAU as she has been having contractions since 10:30 am yesterday.  Contractions are irregular at 4-20 minutes.  Feels them as tightening in her abdomen.  Was worried.  Slept last night but noticed contractions on awakening today.  OB History    Grav Para Term Preterm Abortions TAB SAB Ect Mult Living   1               Past Medical History  Diagnosis Date  . No pertinent past medical history     Past Surgical History  Procedure Date  . No past surgeries     Family History  Problem Relation Age of Onset  . Hypertension Mother   . Hypertension Father   . Hypertension Brother   . Other Neg Hx     History  Substance Use Topics  . Smoking status: Former Games developer  . Smokeless tobacco: Not on file  . Alcohol Use: No    Allergies: No Known Allergies  Prescriptions prior to admission  Medication Sig Dispense Refill  . Prenatal Vit-Fe Fumarate-FA (PRENATAL MULTIVITAMIN) TABS Take 1 tablet by mouth every morning.        Review of Systems  Constitutional: Negative for fever.  Gastrointestinal: Negative for nausea and vomiting.       Contractions  Genitourinary:       No vaginal discharge. No vaginal bleeding. No dysuria.   Physical Exam   Blood pressure 105/74, pulse 101, temperature 98.8 F (37.1 C), temperature source Oral, resp. rate 16, height 5' 3.5" (1.613 m), weight 74.299 kg (163 lb 12.8 oz), last menstrual period 12/19/2011, SpO2 100.00%.  Physical Exam  Nursing note and vitals reviewed. Constitutional: She is oriented to person, place, and time. She appears well-developed and well-nourished.  HENT:  Head: Normocephalic.  Eyes: EOM are normal.  Neck: Neck supple.  GI: Soft. There is no tenderness. There is no rebound and  no guarding.       Uterine irritability noted on monitor strip.  No strong contractions seen on strip or palpated.  FHT baseline is 140.  10x10 accels noted - reassuring strip.  Genitourinary:       Speculum exam: Vulva - labia minora slightly pink Vagina - Minimal amount of creamy discharge, no odor Cervix - No contact bleeding Bimanual exam: Cervix internal os closed, thick, vtx at -3 FFN done Chaperone present for exam.  Musculoskeletal: Normal range of motion.  Neurological: She is alert and oriented to person, place, and time.  Skin: Skin is warm and dry.  Psychiatric: She has a normal mood and affect.    MAU Course  Procedures Results for orders placed during the hospital encounter of 07/07/12 (from the past 24 hour(s))  URINALYSIS, ROUTINE W REFLEX MICROSCOPIC     Status: Normal   Collection Time   07/07/12  5:17 PM      Component Value Range   Color, Urine YELLOW  YELLOW   APPearance CLEAR  CLEAR   Specific Gravity, Urine 1.020  1.005 - 1.030   pH 7.0  5.0 - 8.0   Glucose, UA NEGATIVE  NEGATIVE mg/dL   Hgb urine dipstick NEGATIVE  NEGATIVE   Bilirubin Urine NEGATIVE  NEGATIVE   Ketones, ur  NEGATIVE  NEGATIVE mg/dL   Protein, ur NEGATIVE  NEGATIVE mg/dL   Urobilinogen, UA 0.2  0.0 - 1.0 mg/dL   Nitrite NEGATIVE  NEGATIVE   Leukocytes, UA NEGATIVE  NEGATIVE  FETAL FIBRONECTIN     Status: Normal   Collection Time   07/07/12  6:10 PM      Component Value Range   Fetal Fibronectin NEGATIVE  NEGATIVE   MDM 1810  Consult with Dr. Claiborne Billings re: plan of care 1950  Reviewed client's lab results and current condition - contractions are less frequent according to client; none showing on fetal monitor strip.  Will discharge.  Assessment and Plan  Threat of preterm labor at 28 weeks  Plan Discharge home  Keep appointments in the office as scheduled - 07-22-12. Call your doctor if your contractions are worsening.  At this time, your lab tests are  reassuring.  BURLESON,TERRI 07/07/2012, 6:11 PM

## 2012-07-07 NOTE — MAU Note (Signed)
Patient states she has been having contractions every few minutes since 10-29 am. Denies any bleeding or leaking and reports good fetal movement.

## 2012-07-07 NOTE — MAU Note (Signed)
Pt presents for contractions that started yesterday morning around 1030, and are now occurring every couple of minutes.  Denies any LOF or bleeding.  Reports good fetal movement.

## 2012-09-08 NOTE — L&D Delivery Note (Signed)
Patient was C/C/+2 and pushed for 50 minutes with epidural.   NSVD  female infant, Apgars 8,9, weight P.   The patient had one midline episiotomy repaired with 2-0 vicryl R. Fundus was firm. EBL was expected. Placenta was delivered intact. Vagina was clear.  Baby was vigorous to bedside.  Jaquanna Ballentine A

## 2012-09-16 ENCOUNTER — Inpatient Hospital Stay (HOSPITAL_COMMUNITY)
Admission: AD | Admit: 2012-09-16 | Discharge: 2012-09-16 | Disposition: A | Discharge status: Home / Self Care | Payer: Medicaid Other | Source: Ambulatory Visit | Attending: Obstetrics and Gynecology | Admitting: Obstetrics and Gynecology

## 2012-09-16 ENCOUNTER — Encounter (HOSPITAL_COMMUNITY): Payer: Self-pay

## 2012-09-16 DIAGNOSIS — O479 False labor, unspecified: Secondary | ICD-10-CM | POA: Insufficient documentation

## 2012-09-16 NOTE — MAU Note (Signed)
Patient states she is having contractions every 3 minutes. Denies bleeding or leaking and reports good fetal movement.  

## 2012-09-17 ENCOUNTER — Encounter (HOSPITAL_COMMUNITY): Payer: Self-pay | Admitting: *Deleted

## 2012-09-17 ENCOUNTER — Inpatient Hospital Stay (HOSPITAL_COMMUNITY)
Admission: AD | Admit: 2012-09-17 | Discharge: 2012-09-17 | Disposition: A | Discharge status: Home / Self Care | Payer: Medicaid Other | Source: Ambulatory Visit | Attending: Obstetrics and Gynecology | Admitting: Obstetrics and Gynecology

## 2012-09-17 DIAGNOSIS — O479 False labor, unspecified: Secondary | ICD-10-CM | POA: Insufficient documentation

## 2012-09-17 MED ORDER — ZOLPIDEM TARTRATE 5 MG PO TABS: 5.0000 mg | ORAL_TABLET | Freq: Once | ORAL | Status: DC

## 2012-09-17 NOTE — MAU Note (Signed)
Pt states contractions are closer and harder

## 2012-09-17 NOTE — MAU Note (Signed)
Pt refused ambien 5mg -states she can sleep w/o it

## 2012-09-21 ENCOUNTER — Inpatient Hospital Stay (HOSPITAL_COMMUNITY)
Admission: AD | Admit: 2012-09-21 | Discharge: 2012-09-22 | Disposition: A | Discharge status: Home / Self Care | Payer: Medicaid Other | Source: Ambulatory Visit | Attending: Obstetrics & Gynecology | Admitting: Obstetrics & Gynecology

## 2012-09-21 ENCOUNTER — Encounter (HOSPITAL_COMMUNITY): Payer: Self-pay | Admitting: *Deleted

## 2012-09-21 DIAGNOSIS — R110 Nausea: Secondary | ICD-10-CM

## 2012-09-21 DIAGNOSIS — O212 Late vomiting of pregnancy: Secondary | ICD-10-CM

## 2012-09-21 DIAGNOSIS — O479 False labor, unspecified: Secondary | ICD-10-CM

## 2012-09-21 NOTE — MAU Note (Signed)
Pt states she has been sick," crampy with really bad headaches and my back has been hurting really bad all day"

## 2012-09-21 NOTE — MAU Note (Signed)
PT  FEELS SOME TIGHTENING- AND HAS BACKACHE ALL DAY.   FEELS  NAUSEA- STARTED THIS AM- NO VOMITING .  HUSBAND HAS HAD  HEAD CONGESTION- NO V/D.   HAD DIARRHEA - LOOSE STOOLS.    VE IN OFFICE - ON Friday-  3 CM, THEN HERE Friday NIGHT   3-4 CM.   DENIES HSV AND MRSA.

## 2012-09-22 LAB — URINE MICROSCOPIC-ADD ON

## 2012-09-22 LAB — URINALYSIS, ROUTINE W REFLEX MICROSCOPIC
Bilirubin Urine: NEGATIVE
Hgb urine dipstick: NEGATIVE
Nitrite: NEGATIVE
Protein, ur: NEGATIVE mg/dL
Specific Gravity, Urine: 1.01 (ref 1.005–1.030)
Urobilinogen, UA: 0.2 mg/dL (ref 0.0–1.0)

## 2012-09-22 NOTE — MAU Provider Note (Signed)
History     CSN: [id number]  Arrival date and time: 09/21/12 2217   First Provider Initiated Contact with Patient 09/22/12 0000      Chief Complaint  Patient presents with  . Nausea   HPI Janet Love 21 y.o. [redacted]w[redacted]d Comes to MAU tonight as she has been feeling bad all day.  Has had some abdominal tightening today and had one soft, loose stool this morning.  Has had nausea and back pain almost all day.  Denies any vomiting.   OB History    Grav Para Term Preterm Abortions TAB SAB Ect Mult Living   1 0 0 0 0 0 0 0 0 0       Past Medical History  Diagnosis Date  . No pertinent past medical history     Past Surgical History  Procedure Date  . No past surgeries     Family History  Problem Relation Age of Onset  . Hypertension Mother   . Hypertension Father   . Hypertension Brother   . Other Neg Hx     History  Substance Use Topics  . Smoking status: Former Games developer  . Smokeless tobacco: Never Used  . Alcohol Use: No    Allergies: No Known Allergies  Prescriptions prior to admission  Medication Sig Dispense Refill  . Prenatal Vit-Fe Fumarate-FA (PRENATAL MULTIVITAMIN) TABS Take 1 tablet by mouth every morning.        Review of Systems  Constitutional: Negative for fever.  Gastrointestinal: Positive for nausea, abdominal pain and diarrhea. Negative for vomiting and constipation.  Genitourinary:       No vaginal discharge. No vaginal bleeding. No dysuria.  Musculoskeletal: Positive for back pain.   Physical Exam   Blood pressure 103/63, pulse 103, temperature 98.3 F (36.8 C), temperature source Oral, resp. rate 20, height 5\' 3"  (1.6 m), weight 82.611 kg (182 lb 2 oz), last menstrual period 12/19/2011.  Physical Exam  Nursing note and vitals reviewed. Constitutional: She is oriented to person, place, and time. She appears well-developed and well-nourished. No distress.  HENT:  Head: Normocephalic.  Eyes: EOM are normal.  Neck: Neck supple.  GI:  Soft. There is tenderness. There is no rebound and no guarding.       Has diffuse tenderness near umbilicus and across top of fundus.  Especially tender over left lower rib cage. On Fetal Monitor - irregular contractions.  FHT baseline 125.  Reactive strip.  Genitourinary:       RN performed  Cervical exam - 3 cm.  Unchanged from exam in office.  Musculoskeletal: Normal range of motion.  Neurological: She is alert and oriented to person, place, and time.  Skin: Skin is warm and dry.  Psychiatric: She has a normal mood and affect.    MAU Course  Procedures Results for orders placed during the hospital encounter of 09/21/12 (from the past 24 hour(s))  URINALYSIS, ROUTINE W REFLEX MICROSCOPIC     Status: Abnormal   Collection Time   09/21/12 10:30 PM      Component Value Range   Color, Urine YELLOW  YELLOW   APPearance CLEAR  CLEAR   Specific Gravity, Urine 1.010  1.005 - 1.030   pH 7.0  5.0 - 8.0   Glucose, UA NEGATIVE  NEGATIVE mg/dL   Hgb urine dipstick NEGATIVE  NEGATIVE   Bilirubin Urine NEGATIVE  NEGATIVE   Ketones, ur NEGATIVE  NEGATIVE mg/dL   Protein, ur NEGATIVE  NEGATIVE mg/dL   Urobilinogen, UA 0.2  0.0 - 1.0 mg/dL   Nitrite NEGATIVE  NEGATIVE   Leukocytes, UA TRACE (*) NEGATIVE  URINE MICROSCOPIC-ADD ON     Status: Abnormal   Collection Time   09/21/12 10:30 PM      Component Value Range   Squamous Epithelial / LPF FEW (*) RARE   WBC, UA 0-2  <3 WBC/hpf   RBC / HPF 0-2  <3 RBC/hpf   Bacteria, UA FEW (*) RARE   Urine-Other AMORPHOUS URATES/PHOSPHATES     MDM Discussed diet with client - eating chicken nuggets and fries and [place] food today.  Advised no fried foods to decrease nausea.  Advised nausea may be a normal pregnancy symptom in late pregnancy.  Reviewed more bland, nonfat diet choices. 0030  Consult with Dr. Arlyce Dice re: plan of care  Assessment and Plan  False labor Nausea  Plan Keep appointments in the office. Call your doctor if your symptoms  worsen. Drink at least 8 8-oz glasses of water every day. Take Tylenol 325 mg 2 tablets by mouth every 4 hours if needed for pain. Return if you are having regular contractions or if your water has broken. [name] 09/22/2012, 12:26 AM

## 2012-09-28 ENCOUNTER — Encounter (HOSPITAL_COMMUNITY): Payer: Self-pay | Admitting: *Deleted

## 2012-09-28 ENCOUNTER — Telehealth (HOSPITAL_COMMUNITY): Payer: Self-pay | Admitting: *Deleted

## 2012-09-28 NOTE — Telephone Encounter (Signed)
Preadmission screen  

## 2012-09-30 ENCOUNTER — Encounter (HOSPITAL_COMMUNITY): Payer: Self-pay

## 2012-09-30 ENCOUNTER — Encounter (HOSPITAL_COMMUNITY): Payer: Self-pay | Admitting: Anesthesiology

## 2012-09-30 ENCOUNTER — Inpatient Hospital Stay (HOSPITAL_COMMUNITY)
Admission: RE | Admit: 2012-09-30 | Discharge: 2012-10-02 | DRG: 775 | Disposition: A | Discharge status: Home / Self Care | Payer: Medicaid Other | Source: Ambulatory Visit | Attending: Obstetrics and Gynecology | Admitting: Obstetrics and Gynecology

## 2012-09-30 ENCOUNTER — Inpatient Hospital Stay (HOSPITAL_COMMUNITY): Payer: Medicaid Other | Admitting: Anesthesiology

## 2012-09-30 DIAGNOSIS — O480 Post-term pregnancy: Principal | ICD-10-CM | POA: Diagnosis present

## 2012-09-30 LAB — ABO/RH: ABO/RH(D): A POS

## 2012-09-30 LAB — RPR: RPR Ser Ql: NONREACTIVE

## 2012-09-30 LAB — CBC
Platelets: 185 10*3/uL (ref 150–400)
RDW: 15.3 % (ref 11.5–15.5)
WBC: 13.4 10*3/uL — ABNORMAL HIGH (ref 4.0–10.5)

## 2012-09-30 MED ORDER — DIPHENHYDRAMINE HCL 50 MG/ML IJ SOLN
12.5000 mg | INTRAMUSCULAR | Status: AC | PRN
  Administered 2012-09-30 (×3): 12.5 mg via INTRAVENOUS
  Filled 2012-09-30 (×2): qty 1

## 2012-09-30 MED ORDER — SIMETHICONE 80 MG PO CHEW: 80.0000 mg | CHEWABLE_TABLET | ORAL | Status: DC | PRN

## 2012-09-30 MED ORDER — LACTATED RINGERS IV SOLN
INTRAVENOUS | Status: DC
  Administered 2012-09-30: 11:00:00 via INTRAVENOUS
  Administered 2012-09-30: 500 mL via INTRAVENOUS
  Administered 2012-09-30 (×2): via INTRAVENOUS

## 2012-09-30 MED ORDER — CITRIC ACID-SODIUM CITRATE 334-500 MG/5ML PO SOLN: 30.0000 mL | ORAL | Status: DC | PRN

## 2012-09-30 MED ORDER — IBUPROFEN 800 MG PO TABS
800.0000 mg | ORAL_TABLET | Freq: Three times a day (TID) | ORAL | Status: DC
  Administered 2012-09-30 – 2012-10-02 (×5): 800 mg via ORAL
  Filled 2012-09-30 (×6): qty 1

## 2012-09-30 MED ORDER — FENTANYL 2.5 MCG/ML BUPIVACAINE 1/10 % EPIDURAL INFUSION (WH - ANES)
14.0000 mL/h | INTRAMUSCULAR | Status: DC
  Administered 2012-09-30: 14 mL/h via EPIDURAL
  Filled 2012-09-30: qty 125

## 2012-09-30 MED ORDER — METHYLERGONOVINE MALEATE 0.2 MG PO TABS: 0.2000 mg | ORAL_TABLET | ORAL | Status: DC | PRN

## 2012-09-30 MED ORDER — PHENYLEPHRINE 40 MCG/ML (10ML) SYRINGE FOR IV PUSH (FOR BLOOD PRESSURE SUPPORT)
80.0000 ug | PREFILLED_SYRINGE | INTRAVENOUS | Status: DC | PRN
  Filled 2012-09-30: qty 5

## 2012-09-30 MED ORDER — ONDANSETRON HCL 4 MG/2ML IJ SOLN: 4.0000 mg | INTRAMUSCULAR | Status: DC | PRN

## 2012-09-30 MED ORDER — LANOLIN HYDROUS EX OINT: TOPICAL_OINTMENT | CUTANEOUS | Status: DC | PRN

## 2012-09-30 MED ORDER — SENNOSIDES-DOCUSATE SODIUM 8.6-50 MG PO TABS
2.0000 | ORAL_TABLET | Freq: Every day | ORAL | Status: DC
  Administered 2012-09-30: 2 via ORAL

## 2012-09-30 MED ORDER — OXYTOCIN BOLUS FROM INFUSION
500.0000 mL | INTRAVENOUS | Status: DC
  Administered 2012-09-30: 500 mL via INTRAVENOUS

## 2012-09-30 MED ORDER — TETANUS-DIPHTH-ACELL PERTUSSIS 5-2.5-18.5 LF-MCG/0.5 IM SUSP
0.5000 mL | Freq: Once | INTRAMUSCULAR | Status: AC
  Administered 2012-10-01: 0.5 mL via INTRAMUSCULAR

## 2012-09-30 MED ORDER — BENZOCAINE-MENTHOL 20-0.5 % EX AERO
1.0000 "application " | INHALATION_SPRAY | CUTANEOUS | Status: DC | PRN
  Administered 2012-10-02: 1 via TOPICAL
  Filled 2012-09-30 (×2): qty 56

## 2012-09-30 MED ORDER — LIDOCAINE HCL (PF) 1 % IJ SOLN
INTRAMUSCULAR | Status: DC | PRN
  Administered 2012-09-30 (×2): 5 mL

## 2012-09-30 MED ORDER — DIPHENHYDRAMINE HCL 25 MG PO CAPS: 25.0000 mg | ORAL_CAPSULE | Freq: Four times a day (QID) | ORAL | Status: DC | PRN

## 2012-09-30 MED ORDER — METHYLERGONOVINE MALEATE 0.2 MG/ML IJ SOLN: 0.2000 mg | INTRAMUSCULAR | Status: DC | PRN

## 2012-09-30 MED ORDER — PHENYLEPHRINE 40 MCG/ML (10ML) SYRINGE FOR IV PUSH (FOR BLOOD PRESSURE SUPPORT): 80.0000 ug | PREFILLED_SYRINGE | INTRAVENOUS | Status: DC | PRN

## 2012-09-30 MED ORDER — ZOLPIDEM TARTRATE 5 MG PO TABS: 5.0000 mg | ORAL_TABLET | Freq: Every evening | ORAL | Status: DC | PRN

## 2012-09-30 MED ORDER — ONDANSETRON HCL 4 MG/2ML IJ SOLN: 4.0000 mg | Freq: Four times a day (QID) | INTRAMUSCULAR | Status: DC | PRN

## 2012-09-30 MED ORDER — MAGNESIUM HYDROXIDE 400 MG/5ML PO SUSP: 30.0000 mL | ORAL | Status: DC | PRN

## 2012-09-30 MED ORDER — NALBUPHINE SYRINGE 5 MG/0.5 ML
5.0000 mg | INJECTION | Freq: Once | INTRAMUSCULAR | Status: AC
  Administered 2012-09-30: 5 mg via INTRAVENOUS
  Filled 2012-09-30: qty 0.5

## 2012-09-30 MED ORDER — MEASLES, MUMPS & RUBELLA VAC ~~LOC~~ INJ: 0.5000 mL | INJECTION | Freq: Once | SUBCUTANEOUS | Status: DC

## 2012-09-30 MED ORDER — SODIUM CHLORIDE 0.9 % IV SOLN: 250.0000 mL | INTRAVENOUS | Status: DC | PRN

## 2012-09-30 MED ORDER — DIBUCAINE 1 % RE OINT: 1.0000 "application " | TOPICAL_OINTMENT | RECTAL | Status: DC | PRN

## 2012-09-30 MED ORDER — OXYTOCIN 40 UNITS IN LACTATED RINGERS INFUSION - SIMPLE MED
1.0000 m[IU]/min | INTRAVENOUS | Status: DC
  Administered 2012-09-30: 2 m[IU]/min via INTRAVENOUS
  Filled 2012-09-30: qty 1000

## 2012-09-30 MED ORDER — SODIUM CHLORIDE 0.9 % IJ SOLN: 3.0000 mL | Freq: Two times a day (BID) | INTRAMUSCULAR | Status: DC

## 2012-09-30 MED ORDER — LACTATED RINGERS IV SOLN: 500.0000 mL | Freq: Once | INTRAVENOUS | Status: DC

## 2012-09-30 MED ORDER — OXYCODONE-ACETAMINOPHEN 5-325 MG PO TABS
1.0000 | ORAL_TABLET | ORAL | Status: DC | PRN
  Administered 2012-09-30 – 2012-10-02 (×5): 1 via ORAL
  Filled 2012-09-30 (×5): qty 1

## 2012-09-30 MED ORDER — FERROUS SULFATE 325 (65 FE) MG PO TABS
325.0000 mg | ORAL_TABLET | Freq: Two times a day (BID) | ORAL | Status: DC
  Administered 2012-10-01 – 2012-10-02 (×2): 325 mg via ORAL
  Filled 2012-09-30 (×3): qty 1

## 2012-09-30 MED ORDER — IBUPROFEN 600 MG PO TABS
600.0000 mg | ORAL_TABLET | Freq: Four times a day (QID) | ORAL | Status: DC | PRN
  Filled 2012-09-30: qty 1

## 2012-09-30 MED ORDER — OXYCODONE-ACETAMINOPHEN 5-325 MG PO TABS: 1.0000 | ORAL_TABLET | ORAL | Status: DC | PRN

## 2012-09-30 MED ORDER — EPHEDRINE 5 MG/ML INJ: 10.0000 mg | INTRAVENOUS | Status: DC | PRN

## 2012-09-30 MED ORDER — EPHEDRINE 5 MG/ML INJ
10.0000 mg | INTRAVENOUS | Status: DC | PRN
  Filled 2012-09-30: qty 4

## 2012-09-30 MED ORDER — ACETAMINOPHEN 325 MG PO TABS
650.0000 mg | ORAL_TABLET | ORAL | Status: DC | PRN
  Administered 2012-09-30: 650 mg via ORAL
  Filled 2012-09-30: qty 2

## 2012-09-30 MED ORDER — PRENATAL MULTIVITAMIN CH
1.0000 | ORAL_TABLET | Freq: Every day | ORAL | Status: DC
  Administered 2012-10-01 – 2012-10-02 (×2): 1 via ORAL
  Filled 2012-09-30 (×2): qty 1

## 2012-09-30 MED ORDER — OXYTOCIN 40 UNITS IN LACTATED RINGERS INFUSION - SIMPLE MED
62.5000 mL/h | INTRAVENOUS | Status: DC
  Administered 2012-09-30: 62.5 mL/h via INTRAVENOUS

## 2012-09-30 MED ORDER — LIDOCAINE HCL (PF) 1 % IJ SOLN
30.0000 mL | INTRAMUSCULAR | Status: DC | PRN
  Filled 2012-09-30: qty 30

## 2012-09-30 MED ORDER — WITCH HAZEL-GLYCERIN EX PADS: 1.0000 "application " | MEDICATED_PAD | CUTANEOUS | Status: DC | PRN

## 2012-09-30 MED ORDER — FLEET ENEMA 7-19 GM/118ML RE ENEM: 1.0000 | ENEMA | RECTAL | Status: DC | PRN

## 2012-09-30 MED ORDER — SODIUM CHLORIDE 0.9 % IJ SOLN: 3.0000 mL | INTRAMUSCULAR | Status: DC | PRN

## 2012-09-30 MED ORDER — ONDANSETRON HCL 4 MG PO TABS: 4.0000 mg | ORAL_TABLET | ORAL | Status: DC | PRN

## 2012-09-30 MED ORDER — TERBUTALINE SULFATE 1 MG/ML IJ SOLN: 0.2500 mg | Freq: Once | INTRAMUSCULAR | Status: DC | PRN

## 2012-09-30 MED ORDER — LACTATED RINGERS IV SOLN: 500.0000 mL | INTRAVENOUS | Status: DC | PRN

## 2012-09-30 NOTE — Anesthesia Procedure Notes (Signed)
Epidural Patient location during procedure: OB Start time: 09/30/2012 11:35 AM  Staffing Anesthesiologist: Angus Seller., Harrell Gave. Performed by: anesthesiologist   Preanesthetic Checklist Completed: patient identified, site marked, surgical consent, pre-op evaluation, timeout performed, IV checked, risks and benefits discussed and monitors and equipment checked  Epidural Patient position: sitting Prep: site prepped and draped and DuraPrep Patient monitoring: continuous pulse ox and blood pressure Approach: midline Injection technique: LOR air and LOR saline  Needle:  Needle type: Tuohy  Needle gauge: 17 G Needle length: 9 cm and 9 Needle insertion depth: 5 cm cm Catheter type: closed end flexible Catheter size: 19 Gauge Catheter at skin depth: 10 cm Test dose: negative  Assessment Events: blood not aspirated, injection not painful, no injection resistance, negative IV test and no paresthesia  Additional Notes Patient identified.  Risk benefits discussed including failed block, incomplete pain control, headache, nerve damage, paralysis, blood pressure changes, nausea, vomiting, reactions to medication both toxic or allergic, and postpartum back pain.  Patient expressed understanding and wished to proceed.  All questions were answered.  Sterile technique used throughout procedure and epidural site dressed with sterile barrier dressing. No paresthesia or other complications noted.The patient did not experience any signs of intravascular injection such as tinnitus or metallic taste in mouth nor signs of intrathecal spread such as rapid motor block. Please see nursing notes for vital signs.

## 2012-09-30 NOTE — Anesthesia Preprocedure Evaluation (Signed)

## 2012-09-30 NOTE — H&P (Signed)
22 y.o. [redacted]w[redacted]d  [id number] comes in for inductions for postdates.  Otherwise has good fetal movement and no bleeding.  Past Medical History  Diagnosis Date  . No pertinent past medical history   . Hx of varicella     Past Surgical History  Procedure Date  . No past surgeries     OB History    Grav Para Term Preterm Abortions TAB SAB Ect Mult Living   1 0 0 0 0 0 0 0 0 0      # Outc Date GA Lbr Len/2nd Wgt Sex Del Anes PTL Lv   1 CUR               History   Social History  . Marital Status: Married    Spouse Name: N/Love    Number of Children: N/Love  . Years of Education: N/Love   Occupational History  . Not on file.   Social History Main Topics  . Smoking status: Former Smoker    Quit date: 09/28/2008  . Smokeless tobacco: Never Used  . Alcohol Use: No  . Drug Use: No  . Sexually Active: Yes    Birth Control/ Protection: None   Other Topics Concern  . Not on file   Social History Narrative  . No narrative on file   Review of patient's allergies indicates no known allergies.    Prenatal Transfer Tool  Maternal Diabetes: No Genetic Screening: Declined Maternal Ultrasounds/Referrals: Normal Fetal Ultrasounds or other Referrals:  None Maternal Substance Abuse:  No Significant Maternal Medications:  None Significant Maternal Lab Results: None  Other ZOX:[id number].    Filed Vitals:   09/30/12 0828  BP: 100/74  Pulse: 91  Temp: 98.2 F (36.8 C)  Resp: 20     Lungs/Cor:  NAD Abdomen:  soft, gravid Ex:  no cords, erythema SVE:  4/80/-2, AROM clear FHTs:  130, good STV, NST R, class 1 Toco:  qocc   Love/P   Term postdates induction.  GBS neg.  Janet Love

## 2012-10-01 LAB — CBC
Hemoglobin: 8.8 g/dL — ABNORMAL LOW (ref 12.0–15.0)
MCHC: 31.9 g/dL (ref 30.0–36.0)
Platelets: 140 10*3/uL — ABNORMAL LOW (ref 150–400)
RDW: 15.6 % — ABNORMAL HIGH (ref 11.5–15.5)

## 2012-10-01 LAB — CCBB MATERNAL DONOR DRAW

## 2012-10-01 NOTE — Discharge Summary (Signed)
Obstetric Discharge Summary Reason for Admission: induction of labor Prenatal Procedures: none Intrapartum Procedures: spontaneous vaginal delivery and episiotomy midline Postpartum Procedures: none Complications-Operative and Postpartum: none Hemoglobin  Date Value Range Status  10/01/2012 8.8* 12.0 - 15.0 g/dL Final     HCT  Date Value Range Status  10/01/2012 27.6* 36.0 - 46.0 % Final    Physical Exam:  General: alert Lochia: appropriate Uterine Fundus: firm  Discharge Diagnoses: Post-date pregnancy  Discharge Information: Date: 10/01/2012 Activity: pelvic rest Diet: routine Medications: PNV and Ibuprofen Condition: stable Instructions: refer to practice specific booklet Discharge to: home Follow-up Information    Follow up with HORVATH,MICHELLE A, MD. Schedule an appointment as soon as possible for a visit in 4 weeks.   Contact information:   [address] [place] [address] [phone]          Newborn Data: Live born female  Birth Weight: 8 lb 2 oz (3685 g) APGAR: 8, 9  Home with mother.  Mickel Baas 10/01/2012, 9:19 PM

## 2012-10-01 NOTE — Progress Notes (Signed)
Post Partum Day 1 Subjective: no complaints  Objective: Blood pressure 103/69, pulse 82, temperature 97.8 F (36.6 C), temperature source Oral, resp. rate 18, height 5\' 3"  (1.6 m), weight 180 lb (81.647 kg), last menstrual period 12/19/2011, SpO2 100.00%,  breastfeeding.  Physical Exam:  General: alert Lochia: appropriate Uterine Fundus: firm   Basename 10/01/12 0530 09/30/12 0720  HGB 8.8* 9.9*  HCT 27.6* 31.0*    Assessment/Plan: Plan for discharge tomorrow   LOS: 1 day   Casady Voshell D 10/01/2012, 11:11 AM

## 2012-10-01 NOTE — Anesthesia Postprocedure Evaluation (Signed)
  Anesthesia Post-op Note  Patient: Janet Love  Procedure(s) Performed: * No procedures listed *  Patient Location: Mother/Baby  Anesthesia Type:Epidural  Level of Consciousness: awake, alert , oriented and patient cooperative  Airway and Oxygen Therapy: Patient Spontanous Breathing  Post-op Pain: mild  Post-op Assessment: Patient's Cardiovascular Status Stable, Respiratory Function Stable, Patent Airway, No signs of Nausea or vomiting, Adequate PO intake and Pain level controlled  Post-op Vital Signs: Reviewed and stable  Complications: No apparent anesthesia complications

## 2012-10-03 LAB — TYPE AND SCREEN
ABO/RH(D): A POS
Unit division: 0
Unit division: 0

## 2012-10-07 ENCOUNTER — Emergency Department (HOSPITAL_COMMUNITY)
Admission: EM | Admit: 2012-10-07 | Discharge: 2012-10-07 | Disposition: A | Discharge status: Home / Self Care | Payer: Medicaid Other | Attending: Emergency Medicine | Admitting: Emergency Medicine

## 2012-10-07 ENCOUNTER — Encounter (HOSPITAL_COMMUNITY): Payer: Self-pay

## 2012-10-07 ENCOUNTER — Emergency Department (HOSPITAL_COMMUNITY): Payer: Medicaid Other

## 2012-10-07 DIAGNOSIS — J3489 Other specified disorders of nose and nasal sinuses: Secondary | ICD-10-CM | POA: Insufficient documentation

## 2012-10-07 DIAGNOSIS — R51 Headache: Secondary | ICD-10-CM | POA: Insufficient documentation

## 2012-10-07 DIAGNOSIS — Z3202 Encounter for pregnancy test, result negative: Secondary | ICD-10-CM | POA: Insufficient documentation

## 2012-10-07 DIAGNOSIS — R509 Fever, unspecified: Secondary | ICD-10-CM | POA: Insufficient documentation

## 2012-10-07 DIAGNOSIS — R52 Pain, unspecified: Secondary | ICD-10-CM | POA: Insufficient documentation

## 2012-10-07 DIAGNOSIS — R5381 Other malaise: Secondary | ICD-10-CM | POA: Insufficient documentation

## 2012-10-07 DIAGNOSIS — R6889 Other general symptoms and signs: Secondary | ICD-10-CM | POA: Insufficient documentation

## 2012-10-07 DIAGNOSIS — B349 Viral infection, unspecified: Secondary | ICD-10-CM

## 2012-10-07 DIAGNOSIS — N6459 Other signs and symptoms in breast: Secondary | ICD-10-CM | POA: Insufficient documentation

## 2012-10-07 DIAGNOSIS — R059 Cough, unspecified: Secondary | ICD-10-CM | POA: Insufficient documentation

## 2012-10-07 DIAGNOSIS — Z8619 Personal history of other infectious and parasitic diseases: Secondary | ICD-10-CM | POA: Insufficient documentation

## 2012-10-07 DIAGNOSIS — R05 Cough: Secondary | ICD-10-CM | POA: Insufficient documentation

## 2012-10-07 DIAGNOSIS — R110 Nausea: Secondary | ICD-10-CM | POA: Insufficient documentation

## 2012-10-07 DIAGNOSIS — Z87891 Personal history of nicotine dependence: Secondary | ICD-10-CM | POA: Insufficient documentation

## 2012-10-07 DIAGNOSIS — R109 Unspecified abdominal pain: Secondary | ICD-10-CM | POA: Insufficient documentation

## 2012-10-07 DIAGNOSIS — R5383 Other fatigue: Secondary | ICD-10-CM | POA: Insufficient documentation

## 2012-10-07 LAB — URINALYSIS, ROUTINE W REFLEX MICROSCOPIC
Glucose, UA: NEGATIVE mg/dL
Hgb urine dipstick: NEGATIVE
Specific Gravity, Urine: >1.03 — ABNORMAL HIGH (ref 1.005–1.030)
Urobilinogen, UA: 0.2 mg/dL (ref 0.0–1.0)
pH: 6 (ref 5.0–8.0)

## 2012-10-07 LAB — COMPREHENSIVE METABOLIC PANEL
AST: 11 U/L (ref 0–37)
Albumin: 2.7 g/dL — ABNORMAL LOW (ref 3.5–5.2)
Alkaline Phosphatase: 115 U/L (ref 39–117)
Chloride: 105 mEq/L (ref 96–112)
Potassium: 3.3 mEq/L — ABNORMAL LOW (ref 3.5–5.1)
Total Bilirubin: 0.4 mg/dL (ref 0.3–1.2)

## 2012-10-07 LAB — CBC WITH DIFFERENTIAL/PLATELET
Basophils Absolute: 0 10*3/uL (ref 0.0–0.1)
Eosinophils Relative: 0 % (ref 0–5)
HCT: 30.5 % — ABNORMAL LOW (ref 36.0–46.0)
Lymphocytes Relative: 4 % — ABNORMAL LOW (ref 12–46)
Monocytes Relative: 2 % — ABNORMAL LOW (ref 3–12)
Neutro Abs: 11.3 10*3/uL — ABNORMAL HIGH (ref 1.7–7.7)
RBC: 3.54 MIL/uL — ABNORMAL LOW (ref 3.87–5.11)
RDW: 16.1 % — ABNORMAL HIGH (ref 11.5–15.5)
WBC Morphology: INCREASED
WBC: 12 10*3/uL — ABNORMAL HIGH (ref 4.0–10.5)

## 2012-10-07 LAB — PREGNANCY, URINE: Preg Test, Ur: NEGATIVE

## 2012-10-07 MED ORDER — SODIUM CHLORIDE 0.9 % IV SOLN
INTRAVENOUS | Status: DC
  Administered 2012-10-07: 20:00:00 via INTRAVENOUS

## 2012-10-07 MED ORDER — POTASSIUM CHLORIDE 20 MEQ/15ML (10%) PO LIQD
40.0000 meq | Freq: Once | ORAL | Status: AC
  Administered 2012-10-07: 40 meq via ORAL
  Filled 2012-10-07: qty 30

## 2012-10-07 MED ORDER — ACETAMINOPHEN 500 MG PO TABS
1000.0000 mg | ORAL_TABLET | Freq: Once | ORAL | Status: AC
  Administered 2012-10-07: 1000 mg via ORAL
  Filled 2012-10-07: qty 2

## 2012-10-07 MED ORDER — ONDANSETRON HCL 4 MG/2ML IJ SOLN
4.0000 mg | INTRAMUSCULAR | Status: DC | PRN
  Administered 2012-10-07: 4 mg via INTRAVENOUS
  Filled 2012-10-07: qty 2

## 2012-10-07 MED ORDER — SODIUM CHLORIDE 0.9 % IV BOLUS (SEPSIS)
1000.0000 mL | Freq: Once | INTRAVENOUS | Status: AC
  Administered 2012-10-07: 1000 mL via INTRAVENOUS

## 2012-10-07 MED ORDER — IBUPROFEN 400 MG PO TABS
400.0000 mg | ORAL_TABLET | Freq: Once | ORAL | Status: DC
  Filled 2012-10-07: qty 1

## 2012-10-07 NOTE — ED Provider Notes (Signed)
History     CSN: [id number]  Arrival date & time 10/07/12  1710   First MD Initiated Contact with Patient 10/07/12 1741      Chief Complaint  Patient presents with  . Fever     HPI Pt was seen at 1800.   Per pt, c/o gradual onset and persistence of constant runny/stuffy nose, sinus congestion, nausea, upper abd "cramps," chills/home fevers to "100.5," generalized body aches/fatigue and cough that began yesterday.  States her symptoms began approx 4-5 days after spending time with another family member "who had the flu."  Denies rash, no CP/SOB, no abd pain, no vomiting/diarrhea, no pelvic pain, no vaginal bleeding/discharge.     Received flu shot this year Past Medical History  Diagnosis Date  . Hx of varicella     Past Surgical History  Procedure Date  . No past surgeries     Family History  Problem Relation Age of Onset  . Hypertension Mother   . Hypertension Father   . Hypertension Brother   . Other Neg Hx   . Cancer Maternal Grandmother     non-hodgkins lymphoma  . Cancer Paternal Grandmother     cervical  . Diabetes Paternal Grandmother     History  Substance Use Topics  . Smoking status: Former Smoker    Quit date: 09/28/2008  . Smokeless tobacco: Never Used  . Alcohol Use: No    OB History    Grav Para Term Preterm Abortions TAB SAB Ect Mult Living   1 1 1  0 0 0 0 0 0 1      Review of Systems ROS: Statement: All systems negative except as marked or noted in the HPI; Constitutional: +fever and chills, generalized fatigue and body aches. ; ; Eyes: Negative for eye pain, redness and discharge. ; ; ENMT: Negative for ear pain, hoarseness, sore throat. +nasal congestion, sinus pressure and rhinorrhea. ; ; Cardiovascular: Negative for chest pain, palpitations, diaphoresis, dyspnea and peripheral edema. ; ; Respiratory: Negative for cough, wheezing and stridor. ; ; Gastrointestinal: +nausea. Negative for vomiting, diarrhea, abdominal pain, blood in stool,  hematemesis, jaundice and rectal bleeding. . ; ; Genitourinary: +breasts engorged. Negative for dysuria, flank pain and hematuria. ; ; GYN:  No pelvic pain. No vaginal bleeding, no vaginal discharge, no vulvar pain. ;; Musculoskeletal: Negative for back pain and neck pain. Negative for swelling and trauma.; ; Skin: Negative for pruritus, rash, abrasions, blisters, bruising and skin lesion.; ; Neuro: Negative for headache, lightheadedness and neck stiffness. Negative for weakness, altered level of consciousness , altered mental status, extremity weakness, paresthesias, involuntary movement, seizure and syncope.       Allergies  Review of patient's allergies indicates no known allergies.  Home Medications   Current Outpatient Rx  Name  Route  Sig  Dispense  Refill  . ACETAMINOPHEN 500 MG PO TABS   Oral   Take 500-1,000 mg by mouth daily as needed. For pain         . PRENATAL MULTIVITAMIN CH   Oral   Take 1 tablet by mouth every morning.           BP 100/65  Pulse 114  Temp 100.3 F (37.9 C) (Oral)  Resp 20  Wt 165 lb (74.844 kg)  SpO2 97%  LMP 12/19/2011  Breastfeeding? Yes  Physical Exam 1805: Physical examination:  Nursing notes reviewed; Vital signs and O2 SAT reviewed;  Constitutional: Well developed, Well nourished, Well hydrated, In no acute distress; Head:  Normocephalic, atraumatic; Eyes: EOMI, PERRL, No scleral icterus; ENMT: TM's clear bilat. +edemetous nasal turbinates bilat with clear rhinorrhea. Mouth and pharynx without lesions. No tonsillar exudates. No intra-oral edema. No hoarse voice, no drooling, no stridor. No pain with manipulation of larynx. Mouth and pharynx normal, Mucous membranes moist; Neck: Supple, Full range of motion, No lymphadenopathy; Cardiovascular: Regular rate and rhythm, No murmur, rub, or gallop; Respiratory: Breath sounds clear & equal bilaterally, No rales, rhonchi, wheezes.  Speaking full sentences with ease, Normal respiratory  effort/excursion; Chest: Nontender, Movement normal. +breasts engorged, no erythema, no open wounds, no lesions, no bleeding from nipples.; Abdomen: Soft, Nontender, Nondistended, Normal bowel sounds; Genitourinary: No CVA tenderness; Extremities: Pulses normal, No tenderness, No edema, No calf edema or asymmetry.; Neuro: AA&Ox3, Major CN grossly intact.  Speech clear. No gross focal motor or sensory deficits in extremities.; Skin: Color normal, Warm, Dry.   ED Course  Procedures    MDM  MDM Reviewed: nursing note and vitals Reviewed previous: labs Interpretation: labs and x-ray     Results for orders placed during the hospital encounter of 10/07/12  URINALYSIS, ROUTINE W REFLEX MICROSCOPIC      Component Value Range   Color, Urine YELLOW  YELLOW   APPearance CLEAR  CLEAR   Specific Gravity, Urine >1.030 (*) 1.005 - 1.030   pH 6.0  5.0 - 8.0   Glucose, UA NEGATIVE  NEGATIVE mg/dL   Hgb urine dipstick NEGATIVE  NEGATIVE   Bilirubin Urine NEGATIVE  NEGATIVE   Ketones, ur 15 (*) NEGATIVE mg/dL   Protein, ur 30 (*) NEGATIVE mg/dL   Urobilinogen, UA 0.2  0.0 - 1.0 mg/dL   Nitrite NEGATIVE  NEGATIVE   Leukocytes, UA NEGATIVE  NEGATIVE  PREGNANCY, URINE      Component Value Range   Preg Test, Ur NEGATIVE  NEGATIVE  CBC WITH DIFFERENTIAL      Component Value Range   WBC 12.0 (*) 4.0 - 10.5 K/uL   RBC 3.54 (*) 3.87 - 5.11 MIL/uL   Hemoglobin 9.8 (*) 12.0 - 15.0 g/dL   HCT 16.1 (*) 09.6 - 04.5 %   MCV 86.2  78.0 - 100.0 fL   MCH 27.7  26.0 - 34.0 pg   MCHC 32.1  30.0 - 36.0 g/dL   RDW 40.9 (*) 81.1 - 91.4 %   Platelets 205  150 - 400 K/uL   Neutrophils Relative 94 (*) 43 - 77 %   Lymphocytes Relative 4 (*) 12 - 46 %   Monocytes Relative 2 (*) 3 - 12 %   Eosinophils Relative 0  0 - 5 %   Basophils Relative 0  0 - 1 %   Neutro Abs 11.3 (*) 1.7 - 7.7 K/uL   Lymphs Abs 0.5 (*) 0.7 - 4.0 K/uL   Monocytes Absolute 0.2  0.1 - 1.0 K/uL   Eosinophils Absolute 0.0  0.0 - 0.7 K/uL    Basophils Absolute 0.0  0.0 - 0.1 K/uL   WBC Morphology INCREASED BANDS (>20% BANDS)    COMPREHENSIVE METABOLIC PANEL      Component Value Range   Sodium 136  135 - 145 mEq/L   Potassium 3.3 (*) 3.5 - 5.1 mEq/L   Chloride 105  96 - 112 mEq/L   CO2 19  19 - 32 mEq/L   Glucose, Bld 103 (*) 70 - 99 mg/dL   BUN 11  6 - 23 mg/dL   Creatinine, Ser 7.82  0.50 - 1.10 mg/dL   Calcium  8.7  8.4 - 10.5 mg/dL   Total Protein 6.5  6.0 - 8.3 g/dL   Albumin 2.7 (*) 3.5 - 5.2 g/dL   AST 11  0 - 37 U/L   ALT 30  0 - 35 U/L   Alkaline Phosphatase 115  39 - 117 U/L   Total Bilirubin 0.4  0.3 - 1.2 mg/dL   GFR calc non Af Amer >90  >90 mL/min   GFR calc Af Amer >90  >90 mL/min  LIPASE, BLOOD      Component Value Range   Lipase 9 (*) 11 - 59 U/L  URINE MICROSCOPIC-ADD ON      Component Value Range   Squamous Epithelial / LPF FEW (*) RARE   WBC, UA 0-2  <3 WBC/hpf   Dg Chest 2 View 10/07/2012  *RADIOLOGY REPORT*  Clinical Data: Fever.  Postpartum 09/30/2012.  CHEST - 2 VIEW  Comparison: None.  Findings: No infiltrate, congestive heart failure or pneumothorax.  Mild central pulmonary vascular prominence.  Heart size within normal limits.  Gas filled colon incompletely assessed.  IMPRESSION: No acute abnormality.   Original Report Authenticated By: Lacy Duverney, M.D.     Results for DHARMA, PARE (MRN [id number]) as of 10/07/2012 20:30  Ref. Range 11/23/2011 17:37 09/30/2012 07:20 10/01/2012 05:30 10/07/2012 18:15  WBC Latest Range: 4.0-10.5 K/uL 14.7 (H) 13.4 (H) 15.5 (H) 12.0 (H)     2025:  Pt has used breast pump while in the ED with relief of breast engorgement.  Counseled to call the Breast Feeding counselor at [facility] or her OB/GYN tomorrow to further discuss symptomatic treatment if she is not planning on breast feeding any longer.  No cellulitis on breasts.  Abd and pelvis remain NT on re-exam.  VS improving after IVF.  Has tol PO well without N/V while in the ED. No stooling while in  the ED.  Potassium repleted PO.  Wants to go home now.  Appears viral illness at this time; tx symptomatically.  Pt states her lochia is scant and brown colored; pt denies any change in lochia flow or color, no odor, no vaginal discharge; abd/pelvic exam remains benign:  doubt retained POC/placenta at this time.  Dx and testing d/w pt and family.  Questions answered.  Verb understanding, agreeable to d/c home with outpt f/u.         Laray Anger, DO 10/10/12 1859

## 2012-10-07 NOTE — ED Notes (Signed)
Patient using mechanical breast pump at this time on bi-lateral breasts. Patients breasts are engorged, bilateral nipples are cracked and red. Patient states that they hurt.

## 2012-10-07 NOTE — ED Notes (Signed)
Pt reports fever that began last night that got up to 104.  Pt reports heat to her breast bilaterally.  Pt also reports some n/v.  No redness noted to her breast.

## 2012-10-07 NOTE — ED Notes (Signed)
Pt c/o fever, headache, n/v, upper abd cramping that started last night, was exposed to niece with the flu last weekend, pt also c/o breasts being engorged, states that the baby is not latching on and it is too painful to attempt to pump.  [facility] did have episiotomy with stitches, denies any change in menstrual flow, color is brown, denies any odor, fever >104 at home, decreased appetite over the past few days,

## 2013-12-19 ENCOUNTER — Emergency Department (HOSPITAL_COMMUNITY)
Admission: EM | Admit: 2013-12-19 | Discharge: 2013-12-19 | Disposition: A | Discharge status: Home / Self Care | Payer: Medicaid Other | Attending: Emergency Medicine | Admitting: Emergency Medicine

## 2013-12-19 ENCOUNTER — Encounter (HOSPITAL_COMMUNITY): Payer: Self-pay | Admitting: Emergency Medicine

## 2013-12-19 DIAGNOSIS — Z3202 Encounter for pregnancy test, result negative: Secondary | ICD-10-CM | POA: Insufficient documentation

## 2013-12-19 DIAGNOSIS — S43499A Other sprain of unspecified shoulder joint, initial encounter: Secondary | ICD-10-CM | POA: Insufficient documentation

## 2013-12-19 DIAGNOSIS — Y9389 Activity, other specified: Secondary | ICD-10-CM | POA: Insufficient documentation

## 2013-12-19 DIAGNOSIS — Z8619 Personal history of other infectious and parasitic diseases: Secondary | ICD-10-CM | POA: Insufficient documentation

## 2013-12-19 DIAGNOSIS — X500XXA Overexertion from strenuous movement or load, initial encounter: Secondary | ICD-10-CM | POA: Insufficient documentation

## 2013-12-19 DIAGNOSIS — Y92410 Unspecified street and highway as the place of occurrence of the external cause: Secondary | ICD-10-CM | POA: Insufficient documentation

## 2013-12-19 DIAGNOSIS — T148XXA Other injury of unspecified body region, initial encounter: Secondary | ICD-10-CM

## 2013-12-19 DIAGNOSIS — S46819A Strain of other muscles, fascia and tendons at shoulder and upper arm level, unspecified arm, initial encounter: Principal | ICD-10-CM

## 2013-12-19 DIAGNOSIS — Z87891 Personal history of nicotine dependence: Secondary | ICD-10-CM | POA: Insufficient documentation

## 2013-12-19 LAB — POC URINE PREG, ED: PREG TEST UR: NEGATIVE

## 2013-12-19 MED ORDER — HYDROCODONE-ACETAMINOPHEN 5-325 MG PO TABS: ORAL_TABLET | ORAL | Status: DC

## 2013-12-19 MED ORDER — CYCLOBENZAPRINE HCL 10 MG PO TABS: 10.0000 mg | ORAL_TABLET | Freq: Three times a day (TID) | ORAL | Status: DC | PRN

## 2013-12-19 MED ORDER — NAPROXEN 500 MG PO TABS: 500.0000 mg | ORAL_TABLET | Freq: Two times a day (BID) | ORAL | Status: DC

## 2013-12-19 NOTE — ED Provider Notes (Signed)
CSN: [id number]     Arrival date & time 12/19/13  0908 History   This chart was scribed for Pauline Ausammy Sorayah Schrodt, PA-C by Ladona Ridgelaylor Day, ED scribe. This patient was seen in room APA12/APA12 and the patient's care was started at 0908.  Chief Complaint  Patient presents with  . Shoulder Pain   Patient is a 23 y.o. female presenting with shoulder pain. The history is provided by the patient. No language interpreter was used.  Shoulder Pain This is a new problem. The current episode started more than 1 week ago. The problem occurs constantly. The problem has not changed since onset.Pertinent negatives include no chest pain, no abdominal pain and no shortness of breath. The symptoms are aggravated by bending. The symptoms are relieved by rest. Treatments tried: excedrin. The treatment provided no relief.   HPI Comments: Janet Love is a 23 y.o. female who presents to the Emergency Department for left shoulder pain ongoing for x2 weeks which began suddenly when she made a movement while driving her car x2 weeks ago by reaching to the back seat to grab something while she was driving and felt a pull in her shoulder muscle. She reports when this occurred, she felt sudden sharp pain in her right shoulder muscle; she denies hearing any popping noise from her shoulder. She has been with ongoing pain since time of injury x2 weeks ago. Pain is aggravated with any reaching movements. She reports that pain is alleviated w/rest. She has been taking Excedrin for pain w/no relief. She denies any HA , numbness/weakness or her arms, neck pain, visual changes or dizziness She denies any allergies to medicines. She reports that she works in [employer] and has to do lots of reaching and lifting movements.   Past Medical History  Diagnosis Date  . Hx of varicella    Past Surgical History  Procedure Laterality Date  . No past surgeries     Family History  Problem Relation Age of Onset  . Hypertension Mother   .  Hypertension Father   . Hypertension Brother   . Other Neg Hx   . Cancer Maternal Grandmother     non-hodgkins lymphoma  . Cancer Paternal Grandmother     cervical  . Diabetes Paternal Grandmother    History  Substance Use Topics  . Smoking status: Former Smoker    Quit date: 09/28/2008  . Smokeless tobacco: Never Used  . Alcohol Use: No   OB History   Grav Para Term Preterm Abortions TAB SAB Ect Mult Living   1 1 1  0 0 0 0 0 0 1     Review of Systems  Constitutional: Negative for fever and chills.  Respiratory: Negative for cough and shortness of breath.   Cardiovascular: Negative for chest pain.  Gastrointestinal: Negative for abdominal pain.  Musculoskeletal: Negative for back pain.       Right shoulder pain  All other systems reviewed and are negative.   Allergies  Review of patient's allergies indicates no known allergies.  Home Medications   Current Outpatient Rx  Name  Route  Sig  Dispense  Refill  . aspirin-acetaminophen-caffeine (EXCEDRIN MIGRAINE) 250-250-65 MG per tablet   Oral   Take 2 tablets by mouth every 6 (six) hours as needed for headache.          Triage Vitals: BP 124/71  Pulse 84  Temp(Src) 98.8 F (37.1 C) (Oral)  Resp 18  Ht 5\' 2"  (1.575 m)  Wt 155 lb (70.308 kg)  BMI 28.34 kg/m2  SpO2 100%  LMP 11/23/2013  Physical Exam  Nursing note and vitals reviewed. Constitutional: She is oriented to person, place, and time. She appears well-developed and well-nourished. No distress.  HENT:  Head: Normocephalic and atraumatic.  Neck: Normal range of motion. Neck supple.  Cardiovascular: Normal rate, regular rhythm, normal heart sounds and intact distal pulses.   No murmur heard. Radial pulses are brisk  Pulmonary/Chest: Effort normal and breath sounds normal. No respiratory distress. She exhibits no tenderness.  Abdominal: Soft. She exhibits no distension. There is no tenderness.  Musculoskeletal: She exhibits tenderness. She exhibits no  edema.       Right shoulder: She exhibits tenderness, pain and spasm. She exhibits normal range of motion, no bony tenderness, no swelling, no effusion, no crepitus, no deformity, normal pulse and normal strength.  Tender along right scapular border. Right trapezius muscle tenderness No spinal tenderness 4/5 strength RUE Pain reproduced w/abduction RUE  Neurological: She is alert and oriented to person, place, and time. She has normal strength. No sensory deficit. She exhibits normal muscle tone. Coordination and gait normal.  Reflex Scores:      Tricep reflexes are 2+ on the right side and 2+ on the left side.      Bicep reflexes are 2+ on the right side and 2+ on the left side. Skin: Skin is warm and dry. No rash noted.   ED Course  Procedures (including critical care time) DIAGNOSTIC STUDIES: Oxygen Saturation is 100% on room air, normal by my interpretation.    COORDINATION OF CARE: At 950 AM Discussed treatment plan with patient. Patient agrees.   Labs Review Labs Reviewed  POC URINE PREG, ED   Imaging Review No results found.   EKG Interpretation None      MDM   Final diagnoses:  Muscle strain   No focal neuro deficits. Pt has full ROM of the right arm, no cervical tenderness. Likely musculoskeletal injury.  Pt agrees to symptomatic treatment with naprosyn, vicodin #10 and flexeril and f/u with her PMD for recheck.     I personally performed the services described in this documentation, which was scribed in my presence. The recorded information has been reviewed and is accurate.     Aniya Jolicoeur L. Trisha Mangleriplett, PA-C 12/22/13 1544

## 2013-12-19 NOTE — Discharge Instructions (Signed)

## 2013-12-19 NOTE — ED Notes (Signed)
Right shoulder blade pain times 2 weeks.  Denies any injury

## 2013-12-26 NOTE — ED Provider Notes (Signed)
Medical screening examination/treatment/procedure(s) were performed by non-physician practitioner and as supervising physician I was immediately available for consultation/collaboration.   EKG Interpretation None       Shelda JakesScott W. Eero Dini, MD 12/26/13 [phone]

## 2014-05-17 ENCOUNTER — Emergency Department (HOSPITAL_COMMUNITY)
Admission: EM | Admit: 2014-05-17 | Discharge: 2014-05-17 | Discharge status: Against Medical Advice | Payer: Medicaid Other | Attending: Emergency Medicine | Admitting: Emergency Medicine

## 2014-05-17 ENCOUNTER — Encounter (HOSPITAL_COMMUNITY): Payer: Self-pay | Admitting: Emergency Medicine

## 2014-05-17 DIAGNOSIS — R52 Pain, unspecified: Secondary | ICD-10-CM | POA: Insufficient documentation

## 2014-05-17 DIAGNOSIS — R509 Fever, unspecified: Secondary | ICD-10-CM | POA: Insufficient documentation

## 2014-05-17 DIAGNOSIS — R109 Unspecified abdominal pain: Secondary | ICD-10-CM | POA: Insufficient documentation

## 2014-05-17 DIAGNOSIS — R51 Headache: Secondary | ICD-10-CM | POA: Insufficient documentation

## 2014-05-17 LAB — COMPREHENSIVE METABOLIC PANEL
ALBUMIN: 3.8 g/dL (ref 3.5–5.2)
ALK PHOS: 130 U/L — AB (ref 39–117)
ALT: 117 U/L — ABNORMAL HIGH (ref 0–35)
ANION GAP: 10 (ref 5–15)
AST: 75 U/L — AB (ref 0–37)
BILIRUBIN TOTAL: 0.9 mg/dL (ref 0.3–1.2)
BUN: 6 mg/dL (ref 6–23)
CHLORIDE: 103 meq/L (ref 96–112)
CO2: 25 mEq/L (ref 19–32)
Calcium: 9.3 mg/dL (ref 8.4–10.5)
Creatinine, Ser: 0.83 mg/dL (ref 0.50–1.10)
GFR calc Af Amer: >90 mL/min (ref >90)
GFR calc non Af Amer: >90 mL/min (ref >90)
Glucose, Bld: 90 mg/dL (ref 70–99)
POTASSIUM: 4.5 meq/L (ref 3.7–5.3)
Sodium: 138 mEq/L (ref 137–147)
TOTAL PROTEIN: 7 g/dL (ref 6.0–8.3)

## 2014-05-17 LAB — CBC WITH DIFFERENTIAL/PLATELET
BASOS ABS: 0.1 10*3/uL (ref 0.0–0.1)
BASOS PCT: 1 % (ref 0–1)
EOS ABS: 0.1 10*3/uL (ref 0.0–0.7)
Eosinophils Relative: 1 % (ref 0–5)
HCT: 34.4 % — ABNORMAL LOW (ref 36.0–46.0)
Hemoglobin: 12.1 g/dL (ref 12.0–15.0)
Lymphocytes Relative: 26 % (ref 12–46)
Lymphs Abs: 1.7 10*3/uL (ref 0.7–4.0)
MCH: 30.8 pg (ref 26.0–34.0)
MCHC: 35.2 g/dL (ref 30.0–36.0)
MCV: 87.5 fL (ref 78.0–100.0)
MONOS PCT: 7 % (ref 3–12)
Monocytes Absolute: 0.5 10*3/uL (ref 0.1–1.0)
NEUTROS ABS: 4.4 10*3/uL (ref 1.7–7.7)
NEUTROS PCT: 65 % (ref 43–77)
PLATELETS: 150 10*3/uL (ref 150–400)
RBC: 3.93 MIL/uL (ref 3.87–5.11)
RDW: 13.3 % (ref 11.5–15.5)
WBC: 6.8 10*3/uL (ref 4.0–10.5)

## 2014-05-17 NOTE — ED Notes (Signed)
Called for pt x 2.  Pt not in waiting room.  Visitor reports pt left.

## 2014-05-17 NOTE — ED Notes (Signed)
Pt reports generalized body aches, abdominal pain,intermittent fever and headaches x9 days. nad noted in triage.

## 2014-07-10 ENCOUNTER — Encounter (HOSPITAL_COMMUNITY): Payer: Self-pay | Admitting: Emergency Medicine

## 2014-11-13 DIAGNOSIS — Z975 Presence of (intrauterine) contraceptive device: Secondary | ICD-10-CM | POA: Insufficient documentation

## 2015-04-29 ENCOUNTER — Emergency Department (INDEPENDENT_AMBULATORY_CARE_PROVIDER_SITE_OTHER)
Admission: EM | Admit: 2015-04-29 | Discharge: 2015-04-29 | Disposition: A | Discharge status: Nursing Home (Includes ICF) | Payer: Managed Care, Other (non HMO) | Source: Home / Self Care | Attending: Emergency Medicine | Admitting: Emergency Medicine

## 2015-04-29 ENCOUNTER — Emergency Department (HOSPITAL_COMMUNITY)
Admission: EM | Admit: 2015-04-29 | Discharge: 2015-04-29 | Disposition: A | Discharge status: Home / Self Care | Payer: Managed Care, Other (non HMO) | Attending: Emergency Medicine | Admitting: Emergency Medicine

## 2015-04-29 ENCOUNTER — Encounter (HOSPITAL_COMMUNITY): Payer: Self-pay | Admitting: *Deleted

## 2015-04-29 ENCOUNTER — Encounter (HOSPITAL_COMMUNITY): Payer: Self-pay | Admitting: Emergency Medicine

## 2015-04-29 ENCOUNTER — Emergency Department (HOSPITAL_COMMUNITY): Payer: Managed Care, Other (non HMO)

## 2015-04-29 DIAGNOSIS — J0110 Acute frontal sinusitis, unspecified: Secondary | ICD-10-CM | POA: Diagnosis not present

## 2015-04-29 DIAGNOSIS — Z87891 Personal history of nicotine dependence: Secondary | ICD-10-CM | POA: Insufficient documentation

## 2015-04-29 DIAGNOSIS — R519 Headache, unspecified: Secondary | ICD-10-CM

## 2015-04-29 DIAGNOSIS — R51 Headache: Secondary | ICD-10-CM

## 2015-04-29 DIAGNOSIS — R1111 Vomiting without nausea: Secondary | ICD-10-CM

## 2015-04-29 DIAGNOSIS — Z8619 Personal history of other infectious and parasitic diseases: Secondary | ICD-10-CM | POA: Diagnosis not present

## 2015-04-29 DIAGNOSIS — Z791 Long term (current) use of non-steroidal anti-inflammatories (NSAID): Secondary | ICD-10-CM | POA: Insufficient documentation

## 2015-04-29 MED ORDER — DEXAMETHASONE SODIUM PHOSPHATE 10 MG/ML IJ SOLN
10.0000 mg | Freq: Once | INTRAMUSCULAR | Status: DC
  Filled 2015-04-29: qty 1

## 2015-04-29 MED ORDER — SALINE SPRAY 0.65 % NA SOLN: 1.0000 | NASAL | Status: DC | PRN

## 2015-04-29 MED ORDER — METOCLOPRAMIDE HCL 5 MG/ML IJ SOLN
10.0000 mg | Freq: Once | INTRAMUSCULAR | Status: AC
  Administered 2015-04-29: 10 mg via INTRAMUSCULAR

## 2015-04-29 MED ORDER — SODIUM CHLORIDE 0.9 % IV BOLUS (SEPSIS): 1000.0000 mL | Freq: Once | INTRAVENOUS | Status: DC

## 2015-04-29 MED ORDER — DIPHENHYDRAMINE HCL 50 MG/ML IJ SOLN
25.0000 mg | Freq: Once | INTRAMUSCULAR | Status: AC
  Administered 2015-04-29: 25 mg via INTRAMUSCULAR

## 2015-04-29 MED ORDER — METOCLOPRAMIDE HCL 5 MG/ML IJ SOLN
10.0000 mg | Freq: Once | INTRAMUSCULAR | Status: DC
  Filled 2015-04-29: qty 2

## 2015-04-29 MED ORDER — CETIRIZINE-PSEUDOEPHEDRINE ER 5-120 MG PO TB12: 1.0000 | ORAL_TABLET | Freq: Every day | ORAL | Status: DC

## 2015-04-29 MED ORDER — DEXAMETHASONE SODIUM PHOSPHATE 10 MG/ML IJ SOLN
10.0000 mg | Freq: Once | INTRAMUSCULAR | Status: AC
  Administered 2015-04-29: 10 mg via INTRAMUSCULAR

## 2015-04-29 MED ORDER — DIPHENHYDRAMINE HCL 50 MG/ML IJ SOLN
25.0000 mg | Freq: Once | INTRAMUSCULAR | Status: DC
  Filled 2015-04-29: qty 1

## 2015-04-29 NOTE — ED Provider Notes (Addendum)
CSN: [id number]     Arrival date & time 04/29/15  1702 History   First MD Initiated Contact with Patient 04/29/15 1900     Chief Complaint  Patient presents with  . Headache     (Consider location/radiation/quality/duration/timing/severity/associated sxs/prior Treatment) Patient is a 24 y.o. female presenting with headaches. The history is provided by the patient.  Headache Pain location:  Frontal Quality:  Dull Radiates to: Occipital region. Severity currently:  9/10 Severity at highest:  9/10 Onset quality:  Sudden Duration:  6 days Timing:  Constant Progression:  Unchanged Chronicity:  New Similar to prior headaches: no   Context comment:  Noticed that the headache started after she had been at pool out in the sun for the last 4 days off camping. She denied any alcohol use but was concerned that she may have gotten dehydrated Relieved by:  Nothing Worsened by:  Light and sound Ineffective treatments: Multiple over-the-counter prescriptions. Associated symptoms: congestion, nausea, photophobia and vomiting   Associated symptoms: no abdominal pain, no back pain, no blurred vision, no cough, no dizziness, no facial pain, no fever, no focal weakness, no hearing loss, no neck pain, no neck stiffness, no numbness, no paresthesias and no weakness   Risk factors comment:  No medical problems and no family history of migraines   Past Medical History  Diagnosis Date  . Hx of varicella    Past Surgical History  Procedure Laterality Date  . No past surgeries     Family History  Problem Relation Age of Onset  . Hypertension Mother   . Hypertension Father   . Hypertension Brother   . Other Neg Hx   . Cancer Maternal Grandmother     non-hodgkins lymphoma  . Cancer Paternal Grandmother     cervical  . Diabetes Paternal Grandmother    Social History  Substance Use Topics  . Smoking status: Former Smoker    Quit date: 09/28/2008  . Smokeless tobacco: Never Used  . Alcohol  Use: No   OB History    Gravida Para Term Preterm AB TAB SAB Ectopic Multiple Living   1 1 1  0 0 0 0 0 0 1     Review of Systems  Constitutional: Negative for fever.  HENT: Positive for congestion. Negative for hearing loss.   Eyes: Positive for photophobia. Negative for blurred vision.  Respiratory: Negative for cough.   Gastrointestinal: Positive for nausea and vomiting. Negative for abdominal pain.  Musculoskeletal: Negative for back pain, neck pain and neck stiffness.  Neurological: Positive for headaches. Negative for dizziness, focal weakness, weakness, numbness and paresthesias.  All other systems reviewed and are negative.     Allergies  Review of patient's allergies indicates no known allergies.  Home Medications   Prior to Admission medications   Medication Sig Start Date End Date Taking? Authorizing Provider  aspirin-acetaminophen-caffeine (EXCEDRIN MIGRAINE) [phone] MG per tablet Take 2 tablets by mouth every 6 (six) hours as needed for headache.    Historical Provider, MD  cyclobenzaprine (FLEXERIL) 10 MG tablet Take 1 tablet (10 mg total) by mouth 3 (three) times daily as needed. 12/19/13   Tammy Triplett, PA-C  HYDROcodone-acetaminophen (NORCO/VICODIN) 5-325 MG per tablet Take one-two tabs po q 4-6 hrs prn pain 12/19/13   Tammy Triplett, PA-C  naproxen (NAPROSYN) 500 MG tablet Take 1 tablet (500 mg total) by mouth 2 (two) times daily. 12/19/13   Tammy Triplett, PA-C   BP 120/71 mmHg  Pulse 95  Temp(Src) 98 F (36.7  C) (Oral)  Resp 16  Ht 5\' 2"  (1.575 m)  Wt 158 lb (71.668 kg)  BMI 28.89 kg/m2  SpO2 100% Physical Exam  Constitutional: She is oriented to person, place, and time. She appears well-developed and well-nourished. She appears distressed.  HENT:  Head: Normocephalic and atraumatic.  Mouth/Throat: Oropharynx is clear and moist.  Eyes: Conjunctivae and EOM are normal. Pupils are equal, round, and reactive to light. Right eye exhibits no discharge.  Left eye exhibits no discharge.  photophobia  Neck: Normal range of motion. Neck supple. No spinous process tenderness present. No rigidity. No Brudzinski's sign and no Kernig's sign noted.  Cardiovascular: Normal rate, normal heart sounds and intact distal pulses.   No murmur heard. Pulmonary/Chest: Effort normal and breath sounds normal. No respiratory distress. She has no wheezes. She has no rales.  Abdominal: Soft. She exhibits no distension. There is no tenderness.  Musculoskeletal: Normal range of motion. She exhibits no edema or tenderness.  Lymphadenopathy:    She has no cervical adenopathy.  Neurological: She is alert and oriented to person, place, and time. She has normal strength. No cranial nerve deficit or sensory deficit. Coordination and gait normal. GCS eye subscore is 4. GCS verbal subscore is 5. GCS motor subscore is 6.  Skin: Skin is warm and dry.  Psychiatric: She has a normal mood and affect. Her behavior is normal.  Nursing note and vitals reviewed.   ED Course  Procedures (including critical care time) Labs Review Labs Reviewed - No data to display  Imaging Review Ct Head Wo Contrast  04/29/2015   CLINICAL DATA:  Headache for 6 days.  EXAM: CT HEAD WITHOUT CONTRAST  TECHNIQUE: Contiguous axial images were obtained from the base of the skull through the vertex without intravenous contrast.  COMPARISON:  None.  FINDINGS: The brain appears normal without hemorrhage, infarct, mass lesion, mass effect, midline shift or abnormal extra-axial fluid collection. No hydrocephalus or pneumocephalus. The right sphenoid sinus is completely opacified. There is ethmoid air cell disease. The mastoid air cells are clear. The calvarium is intact.  IMPRESSION: No acute intracranial abnormality.  Complete opacification of the right sphenoid sinus and scattered ethmoid air cell disease.   Electronically Signed   By: Drusilla Kanner M.D.   On: 04/29/2015 20:26   I have personally reviewed  and evaluated these images and lab results as part of my medical decision-making.   EKG Interpretation None      MDM   Final diagnoses:  Acute intractable headache, unspecified headache type  Acute frontal sinusitis, recurrence not specified    Pt with symptoms that sound typical for a prolonged migraine HA without sx suggestive infection, or cavernous vein thrombosis.  Lower suspicion for [facility] but this is the worst headache pt has ever head and no hx of migraines.  However started after multiple days out in the sun at the pool while camping.  Pt is concerned that she may be dehydrated.  N/V started yesterday.  No neuro deficits or complaints.  Denies any tick bites.  Afebrile here and pt has not noted fever.  Seen at urgent care and sent her here for CT. Normal neuro exam and vital signs. Will give HA cocktail and on re-eval.  8:52 PM CT with evidence of sinus opacification which is most likely the cause triggering her headache. It is only been 6 days and do not feel that patient needs antibiotics for this but counseled and using nasal sprays and Zyrtec.  Pt  improved after headache cocktail.  Gwyneth Sprout, MD 04/29/15 [id number]  Gwyneth Sprout, MD 04/29/15 2219

## 2015-04-29 NOTE — ED Provider Notes (Addendum)
CSN: [id number]     Arrival date & time 04/29/15  1409 History   First MD Initiated Contact with Patient 04/29/15 1620     Chief Complaint  Patient presents with  . Headache   (Consider location/radiation/quality/duration/timing/severity/associated sxs/prior Treatment) HPI  She is a 24 year old woman here for evaluation of headache. She states the headache started on Tuesday and has been constant since then. She states it is present when she wakes up. It is located in the frontal area and will radiate around to the occiput. She describes it as a constant pain. It is worse with bright lights, loud noises, and movement. It is also worse if she lays down. She has tried multiple over-the-counter medications including Goody powders, ibuprofen, Excedrin, Tylenol, Sudafed, Advil Cold and Sinus which provide temporary improvement, but do not resolve the headache. She reports several episodes of vomiting yesterday. No vomiting today. She denies any personal or family history of migraine headaches. No focal numbness, tingling, weakness. She does describe generalized fatigue. No known fevers.  Past Medical History  Diagnosis Date  . Hx of varicella    Past Surgical History  Procedure Laterality Date  . No past surgeries     Family History  Problem Relation Age of Onset  . Hypertension Mother   . Hypertension Father   . Hypertension Brother   . Other Neg Hx   . Cancer Maternal Grandmother     non-hodgkins lymphoma  . Cancer Paternal Grandmother     cervical  . Diabetes Paternal Grandmother    Social History  Substance Use Topics  . Smoking status: Former Smoker    Quit date: 09/28/2008  . Smokeless tobacco: Never Used  . Alcohol Use: No   OB History    Gravida Para Term Preterm AB TAB SAB Ectopic Multiple Living   1 1 1  0 0 0 0 0 0 1     Review of Systems As in history of present illness Allergies  Review of patient's allergies indicates no known allergies.  Home Medications    Prior to Admission medications   Medication Sig Start Date End Date Taking? Authorizing Provider  aspirin-acetaminophen-caffeine (EXCEDRIN MIGRAINE) [phone] MG per tablet Take 2 tablets by mouth every 6 (six) hours as needed for headache.    Historical Provider, MD  cyclobenzaprine (FLEXERIL) 10 MG tablet Take 1 tablet (10 mg total) by mouth 3 (three) times daily as needed. 12/19/13   Tammy Triplett, PA-C  HYDROcodone-acetaminophen (NORCO/VICODIN) 5-325 MG per tablet Take one-two tabs po q 4-6 hrs prn pain 12/19/13   Tammy Triplett, PA-C  naproxen (NAPROSYN) 500 MG tablet Take 1 tablet (500 mg total) by mouth 2 (two) times daily. 12/19/13   Tammy Triplett, PA-C   BP 127/82 mmHg  Pulse 116  Temp(Src) 99.1 F (37.3 C) (Oral)  Resp 16  SpO2 100% Physical Exam  Constitutional: She is oriented to person, place, and time. She appears well-developed and well-nourished. No distress.  Appears tired. She did become tearful when discussing transfer to the emergency room.  HENT:  Mouth/Throat: Oropharynx is clear and moist.  Eyes: Conjunctivae and EOM are normal. Pupils are equal, round, and reactive to light. Right eye exhibits no discharge. Left eye exhibits no discharge.  Fundoscopic exam:      The right eye shows no papilledema.       The left eye shows no papilledema.  Neck: Neck supple.  Cardiovascular: Normal rate.   Pulmonary/Chest: Effort normal.  Lymphadenopathy:    She has  no cervical adenopathy.  Neurological: She is alert and oriented to person, place, and time. No cranial nerve deficit. She exhibits normal muscle tone. Coordination normal.    ED Course  Procedures (including critical care time) Labs Review Labs Reviewed - No data to display  Imaging Review No results found.   MDM   1. Headache, unspecified headache type   2. Non-intractable vomiting without nausea, vomiting of unspecified type    I suspect this is a migraine type headache, however with no known  history and the vomiting, will transfer to the emergency room for a head CT. Differential includes meningitis and elevated intracranial pressure.   Charm Rings, MD 04/29/15 1658  Charm Rings, MD 04/29/15 [phone]

## 2015-04-29 NOTE — ED Notes (Signed)
Patient transported to CT 

## 2015-04-29 NOTE — ED Notes (Signed)
Pt c/o headaches since Tuesday, went to [facility] who sent pt to the ED for a CT scan.

## 2015-04-29 NOTE — ED Notes (Signed)
C/o headaches which started tuesdsay States she has been vomiting which started yesterday  Denies any history States does have nasal congestion

## 2015-04-29 NOTE — ED Notes (Signed)
Discharge instructions reviewed with patient/spouse. Understanding verbalized. Wheelchair refused at time of discharge. Patient ambulated without assistance. No distress noted. Denies pain at time of discharge.

## 2016-07-24 LAB — OB RESULTS CONSOLE RPR: RPR: NONREACTIVE

## 2016-07-24 LAB — OB RESULTS CONSOLE HIV ANTIBODY (ROUTINE TESTING): HIV: NONREACTIVE

## 2016-07-24 LAB — OB RESULTS CONSOLE RUBELLA ANTIBODY, IGM: RUBELLA: IMMUNE

## 2016-07-24 LAB — OB RESULTS CONSOLE GC/CHLAMYDIA
Chlamydia: NEGATIVE
Gonorrhea: NEGATIVE

## 2016-07-24 LAB — OB RESULTS CONSOLE HEPATITIS B SURFACE ANTIGEN: HEP B S AG: NEGATIVE

## 2016-09-08 NOTE — L&D Delivery Note (Signed)
Delivery Note Patient pushed for 1 hour.  The patient pushed to +3 station and due to significant discomfort requested an episiotomy to expedite delivery.  A midline episitomy was performed. At 7:24 PM a viable female was delivered via Vaginal, Spontaneous Delivery (Presentation: Occiput Anterior  ).  APGAR: 4, 9; weight  .   Placenta status: Spontaneous, in tact.  Cord: 3V with the following complications: None.  Cord pH: n/a  Anesthesia:  Epidural Episiotomy: Median Lacerations: 2nd degree;Labial Suture Repair: 2.0 3.0 vicryl rapide Est. Blood Loss (mL): 200  Mom to postpartum.  Baby to Couplet care / Skin to Skin.  Rivkah Wolz GEFFEL Marlen Koman 03/14/2017, 8:30 PM

## 2017-02-04 LAB — OB RESULTS CONSOLE GBS: GBS: POSITIVE

## 2017-02-23 ENCOUNTER — Emergency Department (HOSPITAL_COMMUNITY)
Admission: EM | Admit: 2017-02-23 | Discharge: 2017-02-23 | Disposition: A | Discharge status: Home / Self Care | Payer: Medicaid Other | Attending: Emergency Medicine | Admitting: Emergency Medicine

## 2017-02-23 ENCOUNTER — Encounter (HOSPITAL_COMMUNITY): Payer: Self-pay | Admitting: Emergency Medicine

## 2017-02-23 DIAGNOSIS — Z3A38 38 weeks gestation of pregnancy: Secondary | ICD-10-CM | POA: Insufficient documentation

## 2017-02-23 DIAGNOSIS — O2686 Pruritic urticarial papules and plaques of pregnancy (PUPPP): Secondary | ICD-10-CM | POA: Insufficient documentation

## 2017-02-23 DIAGNOSIS — Z7982 Long term (current) use of aspirin: Secondary | ICD-10-CM | POA: Insufficient documentation

## 2017-02-23 DIAGNOSIS — L237 Allergic contact dermatitis due to plants, except food: Secondary | ICD-10-CM | POA: Diagnosis not present

## 2017-02-23 DIAGNOSIS — Z87891 Personal history of nicotine dependence: Secondary | ICD-10-CM | POA: Insufficient documentation

## 2017-02-23 MED ORDER — PREDNISONE 10 MG PO TABS: ORAL_TABLET | ORAL | 0 refills | Status: DC

## 2017-02-23 NOTE — ED Notes (Signed)
Called x1, no answer

## 2017-02-23 NOTE — ED Triage Notes (Signed)
Pt reports rash from poison ivy, states rash spreading to under R eye. Pt states, "I've always had to gert a shot before with poison ivy." Pt [redacted] weeks pregnant.

## 2017-02-23 NOTE — Discharge Instructions (Signed)
Take your prednisone as prescribed. If your rash has completely resolved and you are no longer having symptoms after 1 week I recommend discontinuing your prescription. You may also continue applying calamine lotion as needed for additional relief. I recommend following up with your OB/GYN at her scheduled appointment tomorrow. Please return to the Emergency Department if symptoms worsen or new onset of fever, facial swelling, sensation of throat closing, difficulty breathing, oral lesions, chest pain, shortness of breath, abdominal pain, vomiting, new/worsening rash.

## 2017-02-23 NOTE — ED Provider Notes (Signed)
[facility]-EMERGENCY DEPT Provider Note   CSN: [id number] Arrival date & time: 02/23/17  0865  By signing my name below, I, Thelma Barge, attest that this documentation has been prepared under the direction and in the presence of Melburn Hake, [place]. Electronically Signed: Thelma Barge, Scribe. 02/23/17. 9:23 AM.  History   Chief Complaint Chief Complaint  Patient presents with  . Poison Ivy   The history is provided by the patient. No language interpreter was used.  HPI Comments: Janet Love is a 26 y.o. female who is [redacted] weeks pregnant who presents to the Emergency Department complaining of constant, gradually worsening pruritic skin rashes/bumps on her body that began 1 week ago. She states a plant was growing on her window and she snapped a leaf off when the rash began on her hands and spread to the rest of her body. She has tried using calamine and ivory poison oak cream with mild to no relief. She denies fever, vomiting, SOB, facial swelling, oral lesions/bumps, and throat tightness. She also denies using any new soap or detergents and any new medications. Pt has no other pertinent medical problems.  Past Medical History:  Diagnosis Date  . Hx of varicella     There are no active problems to display for this patient.   Past Surgical History:  Procedure Laterality Date  . NO PAST SURGERIES      OB History    Gravida Para Term Preterm AB Living   2 1 1  0 0 1   SAB TAB Ectopic Multiple Live Births   0 0 0 0 1       Home Medications    Prior to Admission medications   Medication Sig Start Date End Date Taking? Authorizing Provider  aspirin-acetaminophen-caffeine (EXCEDRIN MIGRAINE) [phone] MG per tablet Take 2 tablets by mouth every 6 (six) hours as needed for headache.    [provider]  cetirizine-pseudoephedrine (ZYRTEC-D) 5-120 MG per tablet Take 1 tablet by mouth daily. 04/29/15   Gwyneth Sprout, MD  cyclobenzaprine (FLEXERIL) 10 MG tablet Take 1  tablet (10 mg total) by mouth 3 (three) times daily as needed. 12/19/13   Triplett, Tammy, PA-C  HYDROcodone-acetaminophen (NORCO/VICODIN) 5-325 MG per tablet Take one-two tabs po q 4-6 hrs prn pain 12/19/13   Triplett, Tammy, PA-C  naproxen (NAPROSYN) 500 MG tablet Take 1 tablet (500 mg total) by mouth 2 (two) times daily. 12/19/13   Triplett, Tammy, PA-C  predniSONE (DELTASONE) 10 MG tablet Take 4 tablets daily for 4 days, then take 3 tablets daily for 4 days, then take 2 tablets daily for 4 days, then take 1 tablet daily for 4 days. 02/23/17   Barrett Henle, PA-C  sodium chloride (OCEAN) 0.65 % SOLN nasal spray Place 1 spray into both nostrils as needed for congestion. 04/29/15   Gwyneth Sprout, MD    Family History Family History  Problem Relation Age of Onset  . Hypertension Mother   . Hypertension Father   . Hypertension Brother   . Cancer Maternal Grandmother        non-hodgkins lymphoma  . Cancer Paternal Grandmother        cervical  . Diabetes Paternal Grandmother   . Other Neg Hx     Social History Social History  Substance Use Topics  . Smoking status: Former Smoker    Quit date: 09/28/2008  . Smokeless tobacco: Never Used  . Alcohol use No     Allergies   Patient has no known  allergies.   Review of Systems Review of Systems  Constitutional: Negative for fever.  HENT: Negative for facial swelling.   Respiratory: Negative for shortness of breath.   Gastrointestinal: Negative for abdominal pain and vomiting.  Skin: Positive for rash.     Physical Exam Updated Vital Signs BP 120/80 (BP Location: Right Arm)   Pulse 81   Temp 98.4 F (36.9 C) (Oral)   Resp 18   Ht 5' 2.5" (1.588 m)   Wt 89.4 kg (197 lb)   SpO2 98%   BMI 35.46 kg/m   Physical Exam  Constitutional: She is oriented to person, place, and time. She appears well-developed and well-nourished.  HENT:  Head: Normocephalic and atraumatic.  Mouth/Throat: Uvula is midline, oropharynx is  clear and moist and mucous membranes are normal. No oropharyngeal exudate, posterior oropharyngeal edema, posterior oropharyngeal erythema or tonsillar abscesses. No tonsillar exudate.  No oral lesions.  Eyes: Conjunctivae and EOM are normal. Right eye exhibits no discharge. Left eye exhibits no discharge. No scleral icterus.  Neck: Normal range of motion. Neck supple.  Cardiovascular: Normal rate, regular rhythm, normal heart sounds and intact distal pulses.   Pulmonary/Chest: Effort normal and breath sounds normal. No respiratory distress. She has no wheezes. She has no rales. She exhibits no tenderness.  Abdominal: Soft. Bowel sounds are normal. There is no tenderness.  Gravid abdomen  Musculoskeletal: Normal range of motion. She exhibits no edema.  Neurological: She is alert and oriented to person, place, and time.  Skin: Skin is warm and dry. Rash noted.  Multiple papules and vesicles present on linear erythematous base present to bilateral arms, neck and back. No pustules, bulla or drainage. No lesions on palms or soles.   Nursing note and vitals reviewed.    ED Treatments / Results  DIAGNOSTIC STUDIES: Oxygen Saturation is 98% on RA, normal by my interpretation.    COORDINATION OF CARE: 9:21 AM Discussed treatment plan with pt at bedside and pt agreed to plan. Labs (all labs ordered are listed, but only abnormal results are displayed) Labs Reviewed - No data to display  EKG  EKG Interpretation None       Radiology No results found.  Procedures Procedures (including critical care time)  Medications Ordered in ED Medications - No data to display   Initial Impression / Assessment and Plan / ED Course  I have reviewed the triage vital signs and the nursing notes.  Pertinent labs & imaging results that were available during my care of the patient were reviewed by me and considered in my medical decision making (see chart for details).     Rash consistent with  poison ivy. Patient denies any difficulty breathing or swallowing.  Pt has a patent airway without stridor and is handling secretions without difficulty; no angioedema. No blisters, no pustules, no warmth, no draining sinus tracts, no superficial abscesses, no bullous impetigo, no desquamation, no target lesions with dusky purpura or a central bulla. Not tender to touch. No concern for superimposed infection. No concern for SJS, TEN, TSS, tick borne illness, syphilis or other life-threatening condition. Discussed pt with Dr. Madilyn Hookees. Due to pt being [redacted] weeks pregnant discussed with pt the tx option of steroids (2 week taper). Pt reports she has an appointment with her OBGYN. Plan to d/c pt home with rx for prednisone but pt states she wants to wait to take them until she talks to her husband and sees her OBGYN tomorrow. Advised patient to continue using her  calamine lotion as needed for itch relief. Discussed return precautions.    Final Clinical Impressions(s) / ED Diagnoses   Final diagnoses:  Poison ivy    New Prescriptions Discharge Medication List as of 02/23/2017  9:34 AM    START taking these medications   Details  predniSONE (DELTASONE) 10 MG tablet Take 4 tablets daily for 4 days, then take 3 tablets daily for 4 days, then take 2 tablets daily for 4 days, then take 1 tablet daily for 4 days., Print       I personally performed the services described in this documentation, which was scribed in my presence. The recorded information has been reviewed and is accurate.     Barrett Henle, PA-C 02/23/17 1244    Tilden Fossa, MD 02/25/17 1116

## 2017-03-14 ENCOUNTER — Encounter (HOSPITAL_COMMUNITY): Payer: Self-pay

## 2017-03-14 ENCOUNTER — Inpatient Hospital Stay (HOSPITAL_COMMUNITY): Payer: Medicaid Other | Admitting: Anesthesiology

## 2017-03-14 ENCOUNTER — Inpatient Hospital Stay (HOSPITAL_COMMUNITY)
Admission: RE | Admit: 2017-03-14 | Discharge: 2017-03-16 | DRG: 775 | Disposition: A | Discharge status: Home / Self Care | Payer: Medicaid Other | Source: Ambulatory Visit | Attending: Obstetrics | Admitting: Obstetrics

## 2017-03-14 VITALS — BP 121/72 | HR 72 | Temp 98.0°F | Resp 16 | Ht 62.0 in | Wt 200.0 lb

## 2017-03-14 DIAGNOSIS — Z3A40 40 weeks gestation of pregnancy: Secondary | ICD-10-CM | POA: Diagnosis not present

## 2017-03-14 DIAGNOSIS — O99824 Streptococcus B carrier state complicating childbirth: Secondary | ICD-10-CM | POA: Diagnosis present

## 2017-03-14 DIAGNOSIS — O480 Post-term pregnancy: Principal | ICD-10-CM | POA: Diagnosis present

## 2017-03-14 DIAGNOSIS — Z87891 Personal history of nicotine dependence: Secondary | ICD-10-CM

## 2017-03-14 LAB — CBC
HCT: 32.2 % — ABNORMAL LOW (ref 36.0–46.0)
Hemoglobin: 10.7 g/dL — ABNORMAL LOW (ref 12.0–15.0)
MCH: 30.2 pg (ref 26.0–34.0)
MCHC: 33.2 g/dL (ref 30.0–36.0)
MCV: 91 fL (ref 78.0–100.0)
PLATELETS: 159 10*3/uL (ref 150–400)
RBC: 3.54 MIL/uL — ABNORMAL LOW (ref 3.87–5.11)
RDW: 14.2 % (ref 11.5–15.5)
WBC: 12 10*3/uL — ABNORMAL HIGH (ref 4.0–10.5)

## 2017-03-14 LAB — TYPE AND SCREEN
ABO/RH(D): A POS
Antibody Screen: NEGATIVE

## 2017-03-14 MED ORDER — SIMETHICONE 80 MG PO CHEW
80.0000 mg | CHEWABLE_TABLET | ORAL | Status: DC | PRN
Start: 2017-03-14 — End: 2017-03-16

## 2017-03-14 MED ORDER — OXYTOCIN 40 UNITS IN LACTATED RINGERS INFUSION - SIMPLE MED
1.0000 m[IU]/min | INTRAVENOUS | Status: DC
  Administered 2017-03-14: 2 m[IU]/min via INTRAVENOUS
  Filled 2017-03-14 (×2): qty 1000

## 2017-03-14 MED ORDER — FENTANYL 2.5 MCG/ML BUPIVACAINE 1/10 % EPIDURAL INFUSION (WH - ANES)
14.0000 mL/h | INTRAMUSCULAR | Status: DC | PRN
  Administered 2017-03-14: 14 mL/h via EPIDURAL
  Filled 2017-03-14: qty 100

## 2017-03-14 MED ORDER — OXYCODONE-ACETAMINOPHEN 5-325 MG PO TABS: 2.0000 | ORAL_TABLET | ORAL | Status: DC | PRN

## 2017-03-14 MED ORDER — FENTANYL CITRATE (PF) 100 MCG/2ML IJ SOLN: 50.0000 ug | INTRAMUSCULAR | Status: DC | PRN

## 2017-03-14 MED ORDER — LACTATED RINGERS IV SOLN: 500.0000 mL | Freq: Once | INTRAVENOUS | Status: DC

## 2017-03-14 MED ORDER — PENICILLIN G POTASSIUM 5000000 UNITS IJ SOLR
5.0000 10*6.[IU] | Freq: Once | INTRAMUSCULAR | Status: AC
  Administered 2017-03-14: 5 10*6.[IU] via INTRAVENOUS
  Filled 2017-03-14: qty 5

## 2017-03-14 MED ORDER — ACETAMINOPHEN 325 MG PO TABS: 650.0000 mg | ORAL_TABLET | ORAL | Status: DC | PRN

## 2017-03-14 MED ORDER — OXYCODONE HCL 5 MG PO TABS: 5.0000 mg | ORAL_TABLET | ORAL | Status: DC | PRN

## 2017-03-14 MED ORDER — SOD CITRATE-CITRIC ACID 500-334 MG/5ML PO SOLN: 30.0000 mL | ORAL | Status: DC | PRN

## 2017-03-14 MED ORDER — LIDOCAINE HCL (PF) 1 % IJ SOLN
INTRAMUSCULAR | Status: DC | PRN
  Administered 2017-03-14 (×2): 5 mL via EPIDURAL

## 2017-03-14 MED ORDER — ONDANSETRON HCL 4 MG/2ML IJ SOLN: 4.0000 mg | INTRAMUSCULAR | Status: DC | PRN

## 2017-03-14 MED ORDER — PHENYLEPHRINE 40 MCG/ML (10ML) SYRINGE FOR IV PUSH (FOR BLOOD PRESSURE SUPPORT)
80.0000 ug | PREFILLED_SYRINGE | INTRAVENOUS | Status: DC | PRN
  Filled 2017-03-14: qty 5
  Filled 2017-03-14: qty 10

## 2017-03-14 MED ORDER — LACTATED RINGERS IV SOLN
INTRAVENOUS | Status: DC
  Administered 2017-03-14 (×2): via INTRAVENOUS

## 2017-03-14 MED ORDER — DIPHENHYDRAMINE HCL 50 MG/ML IJ SOLN: 12.5000 mg | INTRAMUSCULAR | Status: DC | PRN

## 2017-03-14 MED ORDER — WITCH HAZEL-GLYCERIN EX PADS: 1.0000 "application " | MEDICATED_PAD | CUTANEOUS | Status: DC | PRN

## 2017-03-14 MED ORDER — LIDOCAINE HCL (PF) 1 % IJ SOLN
30.0000 mL | INTRAMUSCULAR | Status: AC | PRN
  Administered 2017-03-14: 30 mL via SUBCUTANEOUS
  Filled 2017-03-14: qty 30

## 2017-03-14 MED ORDER — BENZOCAINE-MENTHOL 20-0.5 % EX AERO
1.0000 "application " | INHALATION_SPRAY | CUTANEOUS | Status: DC | PRN
  Administered 2017-03-14: 1 via TOPICAL
  Filled 2017-03-14 (×2): qty 56

## 2017-03-14 MED ORDER — PENICILLIN G POT IN DEXTROSE 60000 UNIT/ML IV SOLN
3.0000 10*6.[IU] | INTRAVENOUS | Status: DC
  Administered 2017-03-14 (×2): 3 10*6.[IU] via INTRAVENOUS
  Filled 2017-03-14 (×4): qty 50

## 2017-03-14 MED ORDER — EPHEDRINE 5 MG/ML INJ
10.0000 mg | INTRAVENOUS | Status: DC | PRN
  Filled 2017-03-14: qty 2

## 2017-03-14 MED ORDER — DIPHENHYDRAMINE HCL 25 MG PO CAPS: 25.0000 mg | ORAL_CAPSULE | Freq: Four times a day (QID) | ORAL | Status: DC | PRN

## 2017-03-14 MED ORDER — OXYCODONE HCL 5 MG PO TABS: 10.0000 mg | ORAL_TABLET | ORAL | Status: DC | PRN

## 2017-03-14 MED ORDER — LACTATED RINGERS IV SOLN
500.0000 mL | INTRAVENOUS | Status: DC | PRN
  Administered 2017-03-14: 500 mL via INTRAVENOUS

## 2017-03-14 MED ORDER — IBUPROFEN 600 MG PO TABS
600.0000 mg | ORAL_TABLET | Freq: Four times a day (QID) | ORAL | Status: DC
  Administered 2017-03-14 – 2017-03-16 (×7): 600 mg via ORAL
  Filled 2017-03-14 (×7): qty 1

## 2017-03-14 MED ORDER — TETANUS-DIPHTH-ACELL PERTUSSIS 5-2.5-18.5 LF-MCG/0.5 IM SUSP: 0.5000 mL | Freq: Once | INTRAMUSCULAR | Status: DC

## 2017-03-14 MED ORDER — OXYCODONE-ACETAMINOPHEN 5-325 MG PO TABS: 1.0000 | ORAL_TABLET | ORAL | Status: DC | PRN

## 2017-03-14 MED ORDER — COCONUT OIL OIL: 1.0000 "application " | TOPICAL_OIL | Status: DC | PRN

## 2017-03-14 MED ORDER — ACETAMINOPHEN 325 MG PO TABS
650.0000 mg | ORAL_TABLET | ORAL | Status: DC | PRN
  Administered 2017-03-15 (×2): 650 mg via ORAL
  Filled 2017-03-14 (×2): qty 2

## 2017-03-14 MED ORDER — TERBUTALINE SULFATE 1 MG/ML IJ SOLN
0.2500 mg | Freq: Once | INTRAMUSCULAR | Status: DC | PRN
  Filled 2017-03-14: qty 1

## 2017-03-14 MED ORDER — DIBUCAINE 1 % RE OINT: 1.0000 "application " | TOPICAL_OINTMENT | RECTAL | Status: DC | PRN

## 2017-03-14 MED ORDER — OXYTOCIN 40 UNITS IN LACTATED RINGERS INFUSION - SIMPLE MED
2.5000 [IU]/h | INTRAVENOUS | Status: DC
  Administered 2017-03-14: 2.5 [IU]/h via INTRAVENOUS

## 2017-03-14 MED ORDER — OXYTOCIN BOLUS FROM INFUSION
500.0000 mL | Freq: Once | INTRAVENOUS | Status: AC
  Administered 2017-03-14: 500 mL via INTRAVENOUS

## 2017-03-14 MED ORDER — TERBUTALINE SULFATE 1 MG/ML IJ SOLN: 0.2500 mg | Freq: Once | INTRAMUSCULAR | Status: DC | PRN

## 2017-03-14 MED ORDER — ONDANSETRON HCL 4 MG/2ML IJ SOLN
4.0000 mg | Freq: Four times a day (QID) | INTRAMUSCULAR | Status: DC | PRN
  Administered 2017-03-14: 4 mg via INTRAVENOUS
  Filled 2017-03-14: qty 2

## 2017-03-14 MED ORDER — ONDANSETRON HCL 4 MG PO TABS
4.0000 mg | ORAL_TABLET | ORAL | Status: DC | PRN
Start: 2017-03-14 — End: 2017-03-16

## 2017-03-14 MED ORDER — PHENYLEPHRINE 40 MCG/ML (10ML) SYRINGE FOR IV PUSH (FOR BLOOD PRESSURE SUPPORT)
80.0000 ug | PREFILLED_SYRINGE | INTRAVENOUS | Status: DC | PRN
  Filled 2017-03-14: qty 5

## 2017-03-14 MED ORDER — SENNOSIDES-DOCUSATE SODIUM 8.6-50 MG PO TABS
2.0000 | ORAL_TABLET | ORAL | Status: DC
  Administered 2017-03-14 – 2017-03-15 (×2): 2 via ORAL
  Filled 2017-03-14 (×2): qty 2

## 2017-03-14 MED ORDER — PRENATAL MULTIVITAMIN CH
1.0000 | ORAL_TABLET | Freq: Every day | ORAL | Status: DC
  Administered 2017-03-15 – 2017-03-16 (×2): 1 via ORAL
  Filled 2017-03-14 (×2): qty 1

## 2017-03-14 NOTE — Anesthesia Preprocedure Evaluation (Signed)
Anesthesia Evaluation  Patient identified by MRN, date of birth, ID band Patient awake    Reviewed: Allergy & Precautions, NPO status , Patient's Chart, lab work & pertinent test results  Airway Mallampati: II   Neck ROM: full    Dental   Pulmonary former smoker,    breath sounds clear to auscultation       Cardiovascular negative cardio ROS   Rhythm:regular Rate:Normal     Neuro/Psych    GI/Hepatic   Endo/Other    Renal/GU      Musculoskeletal   Abdominal   Peds  Hematology   Anesthesia Other Findings   Reproductive/Obstetrics (+) Pregnancy                             Anesthesia Physical Anesthesia Plan  ASA: II  Anesthesia Plan: Epidural   Post-op Pain Management:    Induction:   PONV Risk Score and Plan: 2 and Treatment may vary due to age or medical condition  Airway Management Planned: Natural Airway  Additional Equipment:   Intra-op Plan:   Post-operative Plan:   Informed Consent: I have reviewed the patients History and Physical, chart, labs and discussed the procedure including the risks, benefits and alternatives for the proposed anesthesia with the patient or authorized representative who has indicated his/her understanding and acceptance.     Plan Discussed with: Anesthesiologist  Anesthesia Plan Comments:         Anesthesia Quick Evaluation

## 2017-03-14 NOTE — Anesthesia Pain Management Evaluation Note (Signed)
  [facility] Pain Management Visit Note  Patient: Janet FrameShannon S Pelphrey, 26 y.o., female  "Hello I am a member of the anesthesia team at [facility]. We have an anesthesia team available at all times to provide care throughout the hospital, including epidural management and anesthesia for C-section. I don't know your plan for the delivery whether it a natural birth, water birth, IV sedation, nitrous supplementation, doula or epidural, but we want to meet your pain goals."   1.Was your pain managed to your expectations on prior hospitalizations?   Yes   2.What is your expectation for pain management during this hospitalization?     Epidural  3.How can we help you reach that goal? unsure  Record the patient's initial score and the patient's pain goal.   Pain: 2  Pain Goal: 9 [facility] wants you to be able to say your pain was always managed very well.  Cephus ShellingBURGER,Gordana Kewley 03/14/2017

## 2017-03-14 NOTE — Progress Notes (Signed)
Some discomfort w ctx  BP 112/69   Pulse 81   Temp 97.9 F (36.6 C) (Oral)   Resp 18   Ht 5\' 2"  (1.575 m)   Wt 90.7 kg (200 lb)   BMI 36.58 kg/m   Toco: q4-5 min EFM: 130s, mod var, + accels, no decels SVE: 4/50/-3, AROM clear fluid  A&P: g2p1 @ 7544w4d, IOL for post-term pregnancy Cont pitocin Epidural upon request Fsr/gbs positive on pcn / vtx

## 2017-03-14 NOTE — Anesthesia Procedure Notes (Signed)
Epidural Patient location during procedure: OB Start time: 03/14/2017 3:30 PM End time: 03/14/2017 3:39 PM  Staffing Anesthesiologist: Chaney MallingHODIERNE, Fionna Merriott Performed: anesthesiologist   Preanesthetic Checklist Completed: patient identified, site marked, pre-op evaluation, timeout performed, IV checked, risks and benefits discussed and monitors and equipment checked  Epidural Patient position: sitting Prep: DuraPrep Patient monitoring: heart rate, cardiac monitor, continuous pulse ox and blood pressure Approach: midline Location: L2-L3 Injection technique: LOR saline  Needle:  Needle type: Tuohy  Needle gauge: 17 G Needle length: 9 cm Needle insertion depth: 6 cm Catheter type: closed end flexible Catheter size: 19 Gauge Catheter at skin depth: 11 cm Test dose: negative and Other  Assessment Events: blood not aspirated, injection not painful, no injection resistance and negative IV test  Additional Notes Informed consent obtained prior to proceeding including risk of failure, 1% risk of PDPH, risk of minor discomfort and bruising.  Discussed rare but serious complications including epidural abscess, permanent nerve injury, epidural hematoma.  Discussed alternatives to epidural analgesia and patient desires to proceed.  Timeout performed pre-procedure verifying patient name, procedure, and platelet count.  Patient tolerated procedure well. Reason for block:procedure for pain

## 2017-03-14 NOTE — H&P (Signed)
26 y.o. G2P1001 @ 2029w4d presents for IOL for post term pregnancy.  Otherwise has good fetal movement and no bleeding.  Pregnancy has been uncomplicated  Past Medical History:  Diagnosis Date  . Hx of varicella     Past Surgical History:  Procedure Laterality Date  . NO PAST SURGERIES      OB History  Gravida Para Term Preterm AB Living  2 1 1  0 0 1  SAB TAB Ectopic Multiple Live Births  0 0 0 0 1    # Outcome Date GA Lbr Len/2nd Weight Sex Delivery Anes PTL Lv  2 Current           1 Term 09/30/12 1860w6d 03:30 / 02:18 3.685 kg (8 lb 2 oz) M Vag-Spont EPI  LIV     Birth Comments: WNL      Social History   Social History  . Marital status: Married    Spouse name: N/A  . Number of children: N/A  . Years of education: N/A   Occupational History  . Not on file.   Social History Main Topics  . Smoking status: Former Smoker    Quit date: 09/28/2008  . Smokeless tobacco: Never Used  . Alcohol use No  . Drug use: No  . Sexual activity: Yes    Birth control/ protection: IUD   Other Topics Concern  . Not on file   Social History Narrative  . No narrative on file   Patient has no known allergies.    Prenatal Transfer Tool  Maternal Diabetes: No Genetic Screening: Declined Maternal Ultrasounds/Referrals: Normal Fetal Ultrasounds or other Referrals:  None Maternal Substance Abuse:  No Significant Maternal Medications:  None Significant Maternal Lab Results: Lab values include: Group B Strep positive  ABO, Rh:  A+ Antibody:  Neg Rubella:  Immune RPR:   NR HBsAg:   Neg HIV:   Neg GBS:   Positive    Vitals:   03/14/17 0710  BP: 130/77  Pulse: (!) 104  Resp: 18  Temp: 97.6 F (36.4 C)     General:  NAD Abdomen:  soft, gravid, EFW 7-7.5# Ex:  trace edema SVE:  3/50/-3 FHTs:  140s, mod var, + accels, no decels Toco:  irregular   A/P   26 y.o. G2P1001 3429w4d presents for IOL for post term pregnancy Admit to L&D Pitocin now.  AROM after adequate GBS  prophylaxis Epidural upon request  FSR/ vtx/ GBS positive  Shaylie Eklund GEFFEL Braddock Servellon

## 2017-03-15 LAB — CBC
HCT: 30.6 % — ABNORMAL LOW (ref 36.0–46.0)
Hemoglobin: 10.2 g/dL — ABNORMAL LOW (ref 12.0–15.0)
MCH: 30.4 pg (ref 26.0–34.0)
MCHC: 33.3 g/dL (ref 30.0–36.0)
MCV: 91.1 fL (ref 78.0–100.0)
PLATELETS: 153 10*3/uL (ref 150–400)
RBC: 3.36 MIL/uL — ABNORMAL LOW (ref 3.87–5.11)
RDW: 14.3 % (ref 11.5–15.5)
WBC: 12.4 10*3/uL — ABNORMAL HIGH (ref 4.0–10.5)

## 2017-03-15 LAB — RPR: RPR: NONREACTIVE

## 2017-03-15 NOTE — Anesthesia Postprocedure Evaluation (Signed)
Anesthesia Post Note  Patient: Janet Love  Procedure(s) Performed: * No procedures listed *     Patient location during evaluation: Mother Baby Anesthesia Type: Epidural Level of consciousness: awake and alert Pain management: pain level controlled Vital Signs Assessment: post-procedure vital signs reviewed and stable Respiratory status: spontaneous breathing, nonlabored ventilation and respiratory function stable Cardiovascular status: stable Postop Assessment: no headache, no backache, epidural receding and patient able to bend at knees Anesthetic complications: no    Last Vitals:  Vitals:   03/14/17 2200 03/15/17 0154  BP: 118/72 (!) 104/55  Pulse: 98 82  Resp: 18 18  Temp: 37 C 37.1 C    Last Pain:  Vitals:   03/15/17 1147  TempSrc:   PainSc: 4    Pain Goal: Patients Stated Pain Goal: 3 (03/15/17 1147)               Rica RecordsICKELTON,Md Smola

## 2017-03-15 NOTE — Progress Notes (Signed)
Patient is doing well.  She is ambulating, voiding, tolerating PO.  Pain control is good.  Lochia is appropriate  Vitals:   03/14/17 2030 03/14/17 2100 03/14/17 2200 03/15/17 0154  BP: (!) 103/54 120/65 118/72 (!) 104/55  Pulse: 86 85 98 82  Resp: 18 18 18 18   Temp:  99.1 F (37.3 C) 98.6 F (37 C) 98.7 F (37.1 C)  TempSrc:  Oral Oral Oral  SpO2:  100% 99% 98%  Weight:      Height:        NAD Fundus firm Ext: no edema  Lab Results  Component Value Date   WBC 12.4 (H) 03/15/2017   HGB 10.2 (L) 03/15/2017   HCT 30.6 (L) 03/15/2017   MCV 91.1 03/15/2017   PLT 153 03/15/2017    --/--/A POS (07/07 0745)/RImmune  A/P 26 y.o. G2P2002 PPD#1. Routine care.   Expect d/c tomorrow. Circ as outpatient    [facility] Hospital[facility]ANNA GEFFEL [place]

## 2017-03-16 MED ORDER — OXYCODONE-ACETAMINOPHEN 5-325 MG PO TABS: 1.0000 | ORAL_TABLET | ORAL | 0 refills | Status: DC | PRN

## 2017-03-16 MED ORDER — DOCUSATE SODIUM 100 MG PO CAPS: 100.0000 mg | ORAL_CAPSULE | Freq: Two times a day (BID) | ORAL | 0 refills | Status: DC

## 2017-03-16 MED ORDER — IBUPROFEN 600 MG PO TABS: 600.0000 mg | ORAL_TABLET | Freq: Four times a day (QID) | ORAL | 0 refills | Status: DC | PRN

## 2017-03-16 NOTE — Progress Notes (Signed)
UR chart review completed.  

## 2017-03-16 NOTE — Lactation Note (Addendum)
This note was copied from a baby's chart. Lactation Consultation Note  Patient Name: Janet Love EAVWU'JToday's Date: 03/16/2017   Mother states bf going well.  Denies problems or questions. She states she knows how to hand express. Mom encouraged to feed baby 8-12 times/24 hours and with feeding cues.  Mom made aware of O/P services, breastfeeding support groups, community resources, and our phone # for post-discharge questions.  Discussed basics.      Maternal Data    Feeding Feeding Type: Breast Fed Length of feed: 15 min  LATCH Score/Interventions                      Lactation Tools Discussed/Used     Consult Status      Janet Love, Janet Love 03/16/2017, 8:03 AM

## 2017-03-16 NOTE — Discharge Summary (Signed)
Obstetric Discharge Summary Reason for Admission: induction of labor Prenatal Procedures: NST and ultrasound Intrapartum Procedures: spontaneous vaginal delivery Postpartum Procedures: none Complications-Operative and Postpartum: 2nd degree lac Hemoglobin  Date Value Ref Range Status  03/15/2017 10.2 (L) 12.0 - 15.0 g/dL Final   HCT  Date Value Ref Range Status  03/15/2017 30.6 (L) 36.0 - 46.0 % Final    Physical Exam:  General: alert, cooperative and appears stated age 42Lochia: appropriate Uterine Fundus: firm Incision: healing well DVT Evaluation: No evidence of DVT seen on physical exam.  Discharge Diagnoses: Term Pregnancy-delivered  Discharge Information: Date: 03/16/2017 Activity: pelvic rest Diet: routine Medications: Ibuprofen, Colace and Percocet Condition: improved Instructions: refer to practice specific booklet Discharge to: home Follow-up Information    Marlow Baarslark, Dyanna, MD Follow up in 4 week(s).   Specialty:  Obstetrics Why:  For a postpartum evaluation Contact information: [address] [place] [address] [phone]           Newborn Data: Live born female  Birth Weight: 7 lb 7 oz (3374 g) APGAR: 4, 9  Home with mother.  Mamye Bolds H. 03/16/2017, 8:47 AM

## 2017-03-16 NOTE — Lactation Note (Addendum)
This note was copied from a baby's chart. Lactation Consultation Note: Mother breastfeeding infant when I arrived in room. Infant in football hold. Observed frequent sucks and swallows. Mother wants to learn cross cradle hold. Infant released the breast and placed infant in cross cradle hold. Infant latched on quickly and sustained latch for 10 mins. Infant sleepy . Advised mother to remove infants close to rouse and wake infant for better feeding. Mother advised to do good breast massage and ice breast when milk starts coming in. Recommend that mother follow up with OP visit. Mother reports that [facility]. Advised mother to continue to cue base feed infant and to allow for normal cluster feeding. Mother receptive to all teaching.   Patient Name: Janet Love VHQIO'NToday's Date: 03/16/2017 Reason for consult: Follow-up assessment   Maternal Data    Feeding Feeding Type: Breast Fed Length of feed: 30 min  LATCH Score/Interventions Latch: Grasps breast easily, tongue down, lips flanged, rhythmical sucking. Intervention(s): Adjust position;Assist with latch;Breast massage;Breast compression  Audible Swallowing: Spontaneous and intermittent  Type of Nipple: Everted at rest and after stimulation  Comfort (Breast/Nipple): Soft / non-tender     Hold (Positioning): No assistance needed to correctly position infant at breast.  LATCH Score: 10  Lactation Tools Discussed/Used     Consult Status Consult Status: Follow-up Follow-up type: Out-patient    Stevan BornKendrick, Ayona Yniguez [facility] 03/16/2017, 10:42 AM

## 2019-07-18 DIAGNOSIS — R42 Dizziness and giddiness: Secondary | ICD-10-CM | POA: Diagnosis not present

## 2019-07-18 DIAGNOSIS — R55 Syncope and collapse: Secondary | ICD-10-CM | POA: Diagnosis not present

## 2019-07-18 DIAGNOSIS — R112 Nausea with vomiting, unspecified: Secondary | ICD-10-CM | POA: Diagnosis not present

## 2019-08-08 ENCOUNTER — Other Ambulatory Visit: Payer: Self-pay

## 2019-08-08 DIAGNOSIS — Z20822 Contact with and (suspected) exposure to covid-19: Secondary | ICD-10-CM

## 2019-08-09 LAB — NOVEL CORONAVIRUS, NAA: SARS-CoV-2, NAA: NOT DETECTED

## 2019-08-15 ENCOUNTER — Other Ambulatory Visit: Payer: Self-pay

## 2019-08-15 ENCOUNTER — Ambulatory Visit (INDEPENDENT_AMBULATORY_CARE_PROVIDER_SITE_OTHER): Payer: Medicaid Other | Admitting: Family Medicine

## 2019-08-15 ENCOUNTER — Encounter: Payer: Self-pay | Admitting: Family Medicine

## 2019-08-15 VITALS — BP 110/64 | HR 72 | Temp 96.6°F | Resp 14 | Ht 62.0 in | Wt 183.0 lb

## 2019-08-15 DIAGNOSIS — Z7689 Persons encountering health services in other specified circumstances: Secondary | ICD-10-CM

## 2019-08-15 DIAGNOSIS — F411 Generalized anxiety disorder: Secondary | ICD-10-CM | POA: Diagnosis not present

## 2019-08-15 DIAGNOSIS — R002 Palpitations: Secondary | ICD-10-CM | POA: Diagnosis not present

## 2019-08-15 MED ORDER — ESCITALOPRAM OXALATE 10 MG PO TABS: 10.0000 mg | ORAL_TABLET | Freq: Every day | ORAL | 3 refills | Status: DC

## 2019-08-15 NOTE — Progress Notes (Signed)
Subjective:    Patient ID: Janet Love, female    DOB: Mar 19, 1991, 28 y.o.   MRN: [id number]  HPI Patient is here today to establish care.  She is a married mother of 2.  She works as a Quarry manager.  Recently she experienced a "dizzy spell.  She became extremely lightheaded.  She felt like she was going to pass out.  She did not experience significant vertigo.  She felt like her heart started racing.  She called her mother over to where she was working as a Quarry manager.  They checked her blood pressure and it was elevated.  They drove to a local fire department where they obtained an EKG.  I have the EKG that the fire department obtained here.  It shows 68 bpm with normal intervals and a normal axis.  The patient has nonspecific ST changes in lead I a Q-wave in lead aVL otherwise EKG is normal.  There is no evidence of Brugada syndrome or Wolff-Parkinson-White.  Patient states that she has been getting dizzy spells occasionally ever since where she feels lightheaded.  She does not pass out.  However her blood pressure has been elevated at times when she checks it.  She is also been having migraines more frequently.  She states that she is under a lot of stress.  She has a new job as a Quarry manager.  Her 64-year-old just went back to school.  She has been trying to homeschool him as well during the COVID-19 pandemic.  She reports feeling anxious and being under tremendous stress. Past Medical History:  Diagnosis Date  . Hx of varicella    Past Surgical History:  Procedure Laterality Date  . NO PAST SURGERIES     Current Outpatient Medications on File Prior to Visit  Medication Sig Dispense Refill  . levonorgestrel (MIRENA, 52 MG,) 20 MCG/24HR IUD Mirena 20 mcg/24 hours (6 yrs) 52 mg intrauterine device  Take 1 device by intrauterine route.     No current facility-administered medications on file prior to visit.    No Known Allergies Social History   Socioeconomic History  . Marital status: Married    Spouse  name: Not on file  . Number of children: Not on file  . Years of education: Not on file  . Highest education level: Not on file  Occupational History  . Not on file  Social Needs  . Financial resource strain: Not on file  . Food insecurity    Worry: Not on file    Inability: Not on file  . Transportation needs    Medical: Not on file    Non-medical: Not on file  Tobacco Use  . Smoking status: Former Smoker    Quit date: 09/28/2008    Years since quitting: 10.8  . Smokeless tobacco: Never Used  Substance and Sexual Activity  . Alcohol use: No  . Drug use: No  . Sexual activity: Yes    Birth control/protection: I.U.D.  Lifestyle  . Physical activity    Days per week: Not on file    Minutes per session: Not on file  . Stress: Not on file  Relationships  . Social Herbalist on phone: Not on file    Gets together: Not on file    Attends religious service: Not on file    Active member of club or organization: Not on file    Attends meetings of clubs or organizations: Not on file    Relationship  status: Not on file  . Intimate partner violence    Fear of current or ex partner: Not on file    Emotionally abused: Not on file    Physically abused: Not on file    Forced sexual activity: Not on file  Other Topics Concern  . Not on file  Social History Narrative  . Not on file      Review of Systems     Objective:   Physical Exam Vitals signs reviewed.  Constitutional:      General: She is not in acute distress.    Appearance: Normal appearance. She is not ill-appearing or toxic-appearing.  Cardiovascular:     Rate and Rhythm: Normal rate and regular rhythm.     Heart sounds: Normal heart sounds.  Pulmonary:     Effort: Pulmonary effort is normal. No respiratory distress.     Breath sounds: Normal breath sounds. No stridor. No wheezing, rhonchi or rales.  Neurological:     General: No focal deficit present.     Mental Status: She is alert and oriented to  person, place, and time. Mental status is at baseline.     Cranial Nerves: No cranial nerve deficit.     Sensory: No sensory deficit.     Motor: No weakness.     Coordination: Coordination normal.           Assessment & Plan:  Encounter to establish care with new doctor  Palpitations - Plan: CBC with Differential, COMPLETE METABOLIC PANEL WITH GFR, TSH  GAD (generalized anxiety disorder)  I believe the patient is likely having panic attacks with vasovagal episodes.  I believe anxiety may also be triggering migraines and increased adrenaline may be elevating her blood pressure at times.  I recommended checking a CBC, CMP, fasting lipid panel.  Also recommended starting Lexapro 10 mg a day and reassessing the patient in 1 month.  She agrees that she has been her a lot of stress recently and has been dealing increasingly more with anxiety and stress over the last several months.  She is willing to try this medication.  She will notify me if symptoms change.

## 2019-08-17 ENCOUNTER — Other Ambulatory Visit: Payer: Self-pay | Admitting: Family Medicine

## 2019-08-17 LAB — COMPLETE METABOLIC PANEL WITH GFR
AG Ratio: 1.7 (calc) (ref 1.0–2.5)
ALT: 75 U/L — ABNORMAL HIGH (ref 6–29)
AST: 31 U/L — ABNORMAL HIGH (ref 10–30)
Albumin: 4.3 g/dL (ref 3.6–5.1)
Alkaline phosphatase (APISO): 71 U/L (ref 31–125)
BUN: 12 mg/dL (ref 7–25)
CO2: 26 mmol/L (ref 20–32)
Calcium: 9.2 mg/dL (ref 8.6–10.2)
Chloride: 107 mmol/L (ref 98–110)
Creat: 0.83 mg/dL (ref 0.50–1.10)
GFR, Est African American: 110 mL/min/{1.73_m2} (ref >=60)
GFR, Est Non African American: 95 mL/min/{1.73_m2} (ref >=60)
Globulin: 2.5 g/dL (calc) (ref 1.9–3.7)
Glucose, Bld: 85 mg/dL (ref 65–99)
Potassium: 4.4 mmol/L (ref 3.5–5.3)
Sodium: 140 mmol/L (ref 135–146)
Total Bilirubin: 0.5 mg/dL (ref 0.2–1.2)
Total Protein: 6.8 g/dL (ref 6.1–8.1)

## 2019-08-17 LAB — CBC WITH DIFFERENTIAL/PLATELET
Absolute Monocytes: 482 cells/uL (ref 200–950)
Basophils Absolute: 37 cells/uL (ref 0–200)
Basophils Relative: 0.5 %
Eosinophils Absolute: 161 cells/uL (ref 15–500)
Eosinophils Relative: 2.2 %
HCT: 38 % (ref 35.0–45.0)
Hemoglobin: 13.1 g/dL (ref 11.7–15.5)
Lymphs Abs: 1694 cells/uL (ref 850–3900)
MCH: 31.8 pg (ref 27.0–33.0)
MCHC: 34.5 g/dL (ref 32.0–36.0)
MCV: 92.2 fL (ref 80.0–100.0)
MPV: 12 fL (ref 7.5–12.5)
Monocytes Relative: 6.6 %
Neutro Abs: 4928 cells/uL (ref 1500–7800)
Neutrophils Relative %: 67.5 %
Platelets: 219 10*3/uL (ref 140–400)
RBC: 4.12 10*6/uL (ref 3.80–5.10)
RDW: 12.8 % (ref 11.0–15.0)
Total Lymphocyte: 23.2 %
WBC: 7.3 10*3/uL (ref 3.8–10.8)

## 2019-08-17 LAB — HEP PANEL, GENERAL
Hep B Core Total Ab: NONREACTIVE
Hep B S Ab: NONREACTIVE
Hepatitis A AB,Total: NONREACTIVE
Hepatitis B Surface Ag: NONREACTIVE
Hepatitis C Ab: NONREACTIVE
SIGNAL TO CUT-OFF: 0.01 (ref <1.00)

## 2019-08-17 LAB — TEST AUTHORIZATION

## 2019-08-17 LAB — TSH: TSH: 2.64 mIU/L

## 2019-08-17 MED ORDER — ESCITALOPRAM OXALATE 10 MG PO TABS: 10.0000 mg | ORAL_TABLET | Freq: Every day | ORAL | 3 refills | Status: DC

## 2019-08-19 ENCOUNTER — Other Ambulatory Visit: Payer: Self-pay | Admitting: Family Medicine

## 2019-08-19 DIAGNOSIS — R7989 Other specified abnormal findings of blood chemistry: Secondary | ICD-10-CM

## 2019-08-30 ENCOUNTER — Ambulatory Visit
Admission: RE | Admit: 2019-08-30 | Discharge: 2019-08-30 | Disposition: A | Discharge status: Home / Self Care | Payer: Medicaid Other | Source: Ambulatory Visit | Attending: Family Medicine | Admitting: Family Medicine

## 2019-08-30 DIAGNOSIS — R748 Abnormal levels of other serum enzymes: Secondary | ICD-10-CM | POA: Diagnosis not present

## 2019-08-30 DIAGNOSIS — R7989 Other specified abnormal findings of blood chemistry: Secondary | ICD-10-CM

## 2019-09-19 ENCOUNTER — Other Ambulatory Visit: Payer: Self-pay

## 2019-09-19 ENCOUNTER — Ambulatory Visit (INDEPENDENT_AMBULATORY_CARE_PROVIDER_SITE_OTHER): Payer: Medicaid Other | Admitting: Family Medicine

## 2019-09-19 ENCOUNTER — Encounter: Payer: Self-pay | Admitting: Family Medicine

## 2019-09-19 VITALS — BP 126/80 | HR 84 | Temp 96.9°F | Resp 16 | Ht 62.0 in | Wt 183.0 lb

## 2019-09-19 DIAGNOSIS — F411 Generalized anxiety disorder: Secondary | ICD-10-CM

## 2019-09-19 MED ORDER — VENLAFAXINE HCL ER 75 MG PO CP24: 75.0000 mg | ORAL_CAPSULE | Freq: Every day | ORAL | 2 refills | Status: DC

## 2019-09-19 NOTE — Progress Notes (Signed)
Subjective:    Patient ID: Janet Love, female    DOB: 1991-08-11, 29 y.o.   MRN: [id number]  HPI  08/15/19 Patient is here today to establish care.  She is a married mother of 2.  She works as a Lawyer.  Recently she experienced a "dizzy spell.  She became extremely lightheaded.  She felt like she was going to pass out.  She did not experience significant vertigo.  She felt like her heart started racing.  She called her mother over to where she was working as a Lawyer.  They checked her blood pressure and it was elevated.  They drove to a local fire department where they obtained an EKG.  I have the EKG that the fire department obtained here.  It shows 68 bpm with normal intervals and a normal axis.  The patient has nonspecific ST changes in lead I a Q-wave in lead aVL otherwise EKG is normal.  There is no evidence of Brugada syndrome or Wolff-Parkinson-White.  Patient states that she has been getting dizzy spells occasionally ever since where she feels lightheaded.  She does not pass out.  However her blood pressure has been elevated at times when she checks it.  She is also been having migraines more frequently.  She states that she is under a lot of stress.  She has a new job as a Lawyer.  Her 5-year-old just went back to school.  She has been trying to homeschool him as well during the COVID-19 pandemic.  She reports feeling anxious and being under tremendous stress.  At that time, my plan was: I believe the patient is likely having panic attacks with vasovagal episodes.  I believe anxiety may also be triggering migraines and increased adrenaline may be elevating her blood pressure at times.  I recommended checking a CBC, CMP, fasting lipid panel.  Also recommended starting Lexapro 10 mg a day and reassessing the patient in 1 month.  She agrees that she has been her a lot of stress recently and has been dealing increasingly more with anxiety and stress over the last several months.  She is willing to try  this medication.  She will notify me if symptoms change.  09/19/19 Patient has seen no benefit since starting Lexapro.  She continues to have "bad days" at least 50% of the time.  She states that she will feel stressed out all day at work.  When she comes home from work, she feels tired and drained and wants very little interaction with her family.  She is also having panic attacks frequently in her car.  This is not only when she is driving to work.  It also occurs whenever she feels "trapped".  Apparently this initially started when she was waiting in line for her son's school.  She had to use the restroom when she was unable to get out of line.  She felt trapped and helpless.  Ever since then whenever she feels "trapped" in the car she begins to feel panic attack.  This often happens if she is unable to use the restroom prior to leaving the house.  Also occurs if she is stuck in traffic.  She will feel the attacks occurring rapidly.  They feel quickly over a period of 5 minutes.  They include feeling hot all over her body, feeling flushed in her face, feeling palpitations.  Again these always occur when she is in the car and seldom when she is not in the  car although she does generally feel anxious.  She is having 3-4 panic attacks a week.  Lexapro has not helped this at all. Past Medical History:  Diagnosis Date  . Hx of varicella    Past Surgical History:  Procedure Laterality Date  . NO PAST SURGERIES     Current Outpatient Medications on File Prior to Visit  Medication Sig Dispense Refill  . escitalopram (LEXAPRO) 10 MG tablet Take 1 tablet (10 mg total) by mouth daily. 30 tablet 3  . levonorgestrel (MIRENA, 52 MG,) 20 MCG/24HR IUD Mirena 20 mcg/24 hours (6 yrs) 52 mg intrauterine device  Take 1 device by intrauterine route.     No current facility-administered medications on file prior to visit.   No Known Allergies Social History   Socioeconomic History  . Marital status: Married     Spouse name: Not on file  . Number of children: Not on file  . Years of education: Not on file  . Highest education level: Not on file  Occupational History  . Not on file  Tobacco Use  . Smoking status: Former Smoker    Quit date: 09/28/2008    Years since quitting: 10.9  . Smokeless tobacco: Never Used  Substance and Sexual Activity  . Alcohol use: No  . Drug use: No  . Sexual activity: Yes    Birth control/protection: I.U.D.  Other Topics Concern  . Not on file  Social History Narrative  . Not on file   Social Determinants of Health   Financial Resource Strain:   . Difficulty of Paying Living Expenses: Not on file  Food Insecurity:   . Worried About Programme researcher, broadcasting/film/video in the Last Year: Not on file  . Ran Out of Food in the Last Year: Not on file  Transportation Needs:   . Lack of Transportation (Medical): Not on file  . Lack of Transportation (Non-Medical): Not on file  Physical Activity:   . Days of Exercise per Week: Not on file  . Minutes of Exercise per Session: Not on file  Stress:   . Feeling of Stress : Not on file  Social Connections:   . Frequency of Communication with Friends and Family: Not on file  . Frequency of Social Gatherings with Friends and Family: Not on file  . Attends Religious Services: Not on file  . Active Member of Clubs or Organizations: Not on file  . Attends Banker Meetings: Not on file  . Marital Status: Not on file  Intimate Partner Violence:   . Fear of Current or Ex-Partner: Not on file  . Emotionally Abused: Not on file  . Physically Abused: Not on file  . Sexually Abused: Not on file      Review of Systems     Objective:   Physical Exam Vitals reviewed.  Constitutional:      General: She is not in acute distress.    Appearance: Normal appearance. She is not ill-appearing or toxic-appearing.  Cardiovascular:     Rate and Rhythm: Normal rate and regular rhythm.     Heart sounds: Normal heart sounds.    Pulmonary:     Effort: Pulmonary effort is normal. No respiratory distress.     Breath sounds: Normal breath sounds. No stridor. No wheezing, rhonchi or rales.  Neurological:     General: No focal deficit present.     Mental Status: She is alert and oriented to person, place, and time. Mental status is at baseline.  Cranial Nerves: No cranial nerve deficit.     Sensory: No sensory deficit.     Motor: No weakness.     Coordination: Coordination normal.           Assessment & Plan:  GAD (generalized anxiety disorder)  We discussed options including using Xanax as needed for panic attacks, switching Lexapro to a different preventative medicine such as Effexor, or seeing a psychologist for counseling and cognitive behavioral therapy.  I have strongly recommended cognitive behavioral therapy and the patient agrees.  I believe that we can train and empower the patient to break her panic attacks and also help prevent them in the future.  However the patient would also like to try a different medication to see if we can help reduce the frequency of the panic attacks.  She wants to avoid Xanax and addictive medications but would be willing to stop Lexapro and switch to Effexor Xr 75 mg p.o. every morning.

## 2019-10-25 ENCOUNTER — Ambulatory Visit: Payer: Medicaid Other | Attending: Internal Medicine

## 2019-10-25 DIAGNOSIS — Z20822 Contact with and (suspected) exposure to covid-19: Secondary | ICD-10-CM | POA: Diagnosis not present

## 2019-10-26 LAB — NOVEL CORONAVIRUS, NAA: SARS-CoV-2, NAA: DETECTED — AB

## 2019-12-14 ENCOUNTER — Other Ambulatory Visit: Payer: Self-pay | Admitting: Family Medicine

## 2020-04-26 ENCOUNTER — Ambulatory Visit: Payer: Medicaid Other | Admitting: Family Medicine

## 2020-04-26 ENCOUNTER — Other Ambulatory Visit: Payer: Self-pay

## 2020-04-26 ENCOUNTER — Telehealth (INDEPENDENT_AMBULATORY_CARE_PROVIDER_SITE_OTHER): Payer: Medicaid Other | Admitting: Nurse Practitioner

## 2020-04-26 VITALS — BP 110/68 | HR 81 | Temp 98.1°F | Resp 18 | Wt 183.2 lb

## 2020-04-26 DIAGNOSIS — K529 Noninfective gastroenteritis and colitis, unspecified: Secondary | ICD-10-CM | POA: Diagnosis not present

## 2020-04-26 DIAGNOSIS — R110 Nausea: Secondary | ICD-10-CM | POA: Diagnosis not present

## 2020-04-26 MED ORDER — ONDANSETRON 4 MG PO TBDP: 4.0000 mg | ORAL_TABLET | Freq: Three times a day (TID) | ORAL | 0 refills | Status: DC | PRN

## 2020-04-26 NOTE — Progress Notes (Signed)
Established Patient Office Visit  Subjective:  Patient ID: Janet Love, female    DOB: Jul 25, 1991  Age: 29 y.o. MRN: [id number]  CC:  Chief Complaint  Patient presents with  . Diarrhea    started on 08/18, fatigue, headache, urinated once today, no meds  . Emesis    last time throwing up at midnight    HPI KENEDI CILIA is a 29 year old female presenting for sxs of n, v, d. Her sxs started 2 days ago. She does not recall r/t food consumption. She reports one episode of d and v last night. She has decreased appetite r/t nausea. [facility] 3 weeks ago normal has the mirena, she denied possibility of pregnancy, reports normal urine color, no gu sxs, no fever, chills, headache, nasal congestion, drainage, loss of taste or smell, She denied any sick contacts with similar sxs.  No covid vaccine, she reports wearing a mask outside of the home, no known covid exposure.  No cp, ct, pain, edema.   Tx plan discussed of out of work until sxs free for 24 to 48 hours and pending covid test if positive will need to quarantine pending further instructions, Diet instructions reviewed and will prescribe Zofran prn nausea.   Past Medical History:  Diagnosis Date  . Anxiety    [employer] 04/26/2020  . Hx of varicella     Past Surgical History:  Procedure Laterality Date  . NO PAST SURGERIES      Family History  Problem Relation Age of Onset  . Hypertension Mother   . Hypertension Father   . Hypertension Brother   . Cancer Maternal Grandmother        non-hodgkins lymphoma  . Cancer Paternal Grandmother        cervical  . Diabetes Paternal Grandmother   . Other Neg Hx     Social History   Socioeconomic History  . Marital status: Married    Spouse name: Not on file  . Number of children: Not on file  . Years of education: Not on file  . Highest education level: Not on file  Occupational History  . Not on file  Tobacco Use  . Smoking status: Former Smoker    Quit date: 09/28/2008     Years since quitting: 11.5  . Smokeless tobacco: Never Used  Vaping Use  . Vaping Use: Never used  Substance and Sexual Activity  . Alcohol use: No  . Drug use: No  . Sexual activity: Yes    Birth control/protection: I.U.D.  Other Topics Concern  . Not on file  Social History Narrative  . Not on file   Social Determinants of Health   Financial Resource Strain:   . Difficulty of Paying Living Expenses: Not on file  Food Insecurity:   . Worried About Programme researcher, broadcasting/film/video in the Last Year: Not on file  . Ran Out of Food in the Last Year: Not on file  Transportation Needs:   . Lack of Transportation (Medical): Not on file  . Lack of Transportation (Non-Medical): Not on file  Physical Activity:   . Days of Exercise per Week: Not on file  . Minutes of Exercise per Session: Not on file  Stress:   . Feeling of Stress : Not on file  Social Connections:   . Frequency of Communication with Friends and Family: Not on file  . Frequency of Social Gatherings with Friends and Family: Not on file  . Attends Religious Services: Not  on file  . Active Member of Clubs or Organizations: Not on file  . Attends Banker Meetings: Not on file  . Marital Status: Not on file  Intimate Partner Violence:   . Fear of Current or Ex-Partner: Not on file  . Emotionally Abused: Not on file  . Physically Abused: Not on file  . Sexually Abused: Not on file    Outpatient Medications Prior to Visit  Medication Sig Dispense Refill  . levonorgestrel (MIRENA, 52 MG,) 20 MCG/24HR IUD Mirena 20 mcg/24 hours (6 yrs) 52 mg intrauterine device  Take 1 device by intrauterine route.    . venlafaxine XR (EFFEXOR-XR) 75 MG 24 hr capsule TAKE 1 CAPSULE(75 MG) BY MOUTH DAILY WITH BREAKFAST. STOP LEXAPRO 30 capsule 2  . escitalopram (LEXAPRO) 10 MG tablet Take 1 tablet (10 mg total) by mouth daily. 30 tablet 3   No facility-administered medications prior to visit.    No Known Allergies  ROS Review of  Systems  All other systems reviewed and are negative.     Objective:    Physical Exam Vitals and nursing note reviewed.  Constitutional:      General: She is not in acute distress.    Appearance: Normal appearance. She is well-developed and well-groomed. She is not ill-appearing, toxic-appearing or diaphoretic.  HENT:     Head: Normocephalic.     Right Ear: Hearing, ear canal and external ear normal.     Left Ear: Hearing, ear canal and external ear normal.     Nose: Nose normal. No congestion or rhinorrhea.     Mouth/Throat:     Lips: Pink.     Mouth: Mucous membranes are dry.     Tongue: No lesions. Tongue does not deviate from midline.     Pharynx: Oropharynx is clear. No oropharyngeal exudate or posterior oropharyngeal erythema.  Eyes:     General: Lids are normal. Lids are everted, no foreign bodies appreciated.     Extraocular Movements: Extraocular movements intact.     Conjunctiva/sclera: Conjunctivae normal.     Pupils: Pupils are equal, round, and reactive to light.  Neck:     Vascular: No JVD.  Cardiovascular:     Rate and Rhythm: Normal rate and regular rhythm.     Pulses: Normal pulses.     Heart sounds: Normal heart sounds.  Pulmonary:     Effort: Pulmonary effort is normal.     Breath sounds: Normal breath sounds.  Abdominal:     General: Abdomen is flat. Bowel sounds are normal.     Palpations: Abdomen is soft.     Tenderness: There is no right CVA tenderness, left CVA tenderness, guarding or rebound. Negative signs include Murphy's sign, Rovsing's sign, McBurney's sign, psoas sign and obturator sign.     Hernia: No hernia is present.  Musculoskeletal:        General: Normal range of motion.     Cervical back: Normal range of motion and neck supple.     Right lower leg: No edema.     Left lower leg: No edema.  Skin:    General: Skin is warm and dry.     Coloration: Skin is not jaundiced or pale.  Neurological:     General: No focal deficit present.      Mental Status: She is alert and oriented to person, place, and time.     Gait: Gait normal.  Psychiatric:        Attention and Perception: Attention  normal.        Mood and Affect: Mood normal.        Speech: Speech normal.        Behavior: Behavior normal. Behavior is cooperative.        Thought Content: Thought content normal.        Cognition and Memory: Cognition normal.        Judgment: Judgment normal.     BP 110/68 (BP Location: Left Arm, Patient Position: Sitting, Cuff Size: Normal)   Pulse 81   Temp 98.1 F (36.7 C) (Temporal)   Resp 18   Wt 183 lb 3.2 oz (83.1 kg)   SpO2 98%   BMI 33.51 kg/m  Wt Readings from Last 3 Encounters:  04/26/20 183 lb 3.2 oz (83.1 kg)  09/19/19 183 lb (83 kg)  08/15/19 183 lb (83 kg)     Health Maintenance Due  Topic Date Due  . COVID-19 Vaccine (1) Never done  . PAP SMEAR-Modifier  Never done  . INFLUENZA VACCINE  04/08/2020    There are no preventive care reminders to display for this patient.  Lab Results  Component Value Date   TSH 2.64 08/15/2019   Lab Results  Component Value Date   WBC 7.3 08/15/2019   HGB 13.1 08/15/2019   HCT 38.0 08/15/2019   MCV 92.2 08/15/2019   PLT 219 08/15/2019   Lab Results  Component Value Date   NA 140 08/15/2019   K 4.4 08/15/2019   CO2 26 08/15/2019   GLUCOSE 85 08/15/2019   BUN 12 08/15/2019   CREATININE 0.83 08/15/2019   BILITOT 0.5 08/15/2019   ALKPHOS 130 (H) 05/17/2014   AST 31 (H) 08/15/2019   ALT 75 (H) 08/15/2019   PROT 6.8 08/15/2019   ALBUMIN 3.8 05/17/2014   CALCIUM 9.2 08/15/2019   ANIONGAP 10 05/17/2014   No results found for: CHOL No results found for: HDL No results found for: LDLCALC No results found for: TRIG No results found for: CHOLHDL No results found for: KGYJ8H    Assessment & Plan:   Problem List Items Addressed This Visit    None    Visit Diagnoses    Gastroenteritis    -  Primary   Relevant Orders   SARS-COV-2 RNA,(COVID-19) QUAL NAAT    Nausea       Relevant Medications   ondansetron (ZOFRAN ODT) 4 MG disintegrating tablet    Clear liquid diet today advance to full liquid then to bland then regular as tolerated  Work note stay out of work and quarantine: you have a virus and should be sxs free for 24 to 48 hours before returning to work. COVID tested completed in clinic may take 1-4 days for result.    Take over-the-counter and prescription medicines only as told by your doctor.  Rest at home while you get better.  Drink enough fluid to keep your pee (urine) pale yellow.  Take slow and deep breaths when you feel sick to your stomach.  Avoid food or things that have strong smells.  Wash your hands often with soap and water. If you cannot use soap and water, use hand sanitizer.  Make sure that all people in your home wash their hands well and often.  Keep all follow-up visits as told by your doctor. This is important. Contact a doctor if:  You feel sicker to your stomach.  You feel sick to your stomach for more than 2 days.  You throw up.  You are not able to drink fluids without throwing up.  You have new symptoms.  You have a fever.  You have a headache.  You have muscle cramps.  You have a rash.  You have pain while peeing.  You feel light-headed or dizzy. Get help right away if:  You have pain in your chest, neck, arm, or jaw.  You feel very weak or you pass out (faint).  You have throw up that is bright red or looks like coffee grounds.  You have bloody or black poop (stools) or poop that looks like tar.  You have a very bad headache, a stiff neck, or both.  You have very bad pain, cramping, or bloating in your belly (abdomen).  You have trouble breathing or you are breathing very quickly.  Your heart is beating very quickly.  Your skin feels cold and clammy.  You feel confused.  You have signs of losing too much water in your body, such as: ? Dark pee, very little pee, or  no pee. ? Cracked lips. ? Dry mouth. ? Sunken eyes. ? Sleepiness. ? Weakness. These symptoms may be an emergency. Do not wait to see if the symptoms will go away. Get medical help right away. Call your local emergency services (911 in the [place] Do not drive yourself to the hospital.   Follow-up: Return if symptoms worsen or fail to improve.    Elmore Guiserystal A Angelo Caroll, FNP

## 2020-04-26 NOTE — Patient Instructions (Addendum)
Clear liquid diet today advance to full liquid then to bland then regular as tolerated  Work note stay out of work and quarantine: you have a virus and should be sxs free for 24 to 48 hours before returning to work. COVID tested completed in clinic may take 1-4 days for result.    Take over-the-counter and prescription medicines only as told by your doctor.  Rest at home while you get better.  Drink enough fluid to keep your pee (urine) pale yellow.  Take slow and deep breaths when you feel sick to your stomach.  Avoid food or things that have strong smells.  Wash your hands often with soap and water. If you cannot use soap and water, use hand sanitizer.  Make sure that all people in your home wash their hands well and often.  Keep all follow-up visits as told by your doctor. This is important. Contact a doctor if:  You feel sicker to your stomach.  You feel sick to your stomach for more than 2 days.  You throw up.  You are not able to drink fluids without throwing up.  You have new symptoms.  You have a fever.  You have a headache.  You have muscle cramps.  You have a rash.  You have pain while peeing.  You feel light-headed or dizzy. Get help right away if:  You have pain in your chest, neck, arm, or jaw.  You feel very weak or you pass out (faint).  You have throw up that is bright red or looks like coffee grounds.  You have bloody or black poop (stools) or poop that looks like tar.  You have a very bad headache, a stiff neck, or both.  You have very bad pain, cramping, or bloating in your belly (abdomen).  You have trouble breathing or you are breathing very quickly.  Your heart is beating very quickly.  Your skin feels cold and clammy.  You feel confused.  You have signs of losing too much water in your body, such as: ? Dark pee, very little pee, or no pee. ? Cracked lips. ? Dry mouth. ? Sunken eyes. ? Sleepiness. ? Weakness. These symptoms  may be an emergency. Do not wait to see if the symptoms will go away. Get medical help right away. Call your local emergency services (911 in the [place] Do not drive yourself to the hospital.

## 2020-04-29 LAB — SARS-COV-2 RNA,(COVID-19) QUALITATIVE NAAT: SARS CoV2 RNA: NOT DETECTED

## 2020-04-30 NOTE — Progress Notes (Signed)
SARS CoV2 RNA Not Detect Not Detected

## 2020-06-04 ENCOUNTER — Other Ambulatory Visit: Payer: Self-pay | Admitting: Family Medicine

## 2020-09-17 ENCOUNTER — Telehealth (INDEPENDENT_AMBULATORY_CARE_PROVIDER_SITE_OTHER): Payer: Medicaid Other | Admitting: Nurse Practitioner

## 2020-09-17 ENCOUNTER — Other Ambulatory Visit: Payer: Self-pay

## 2020-09-17 DIAGNOSIS — Z9189 Other specified personal risk factors, not elsewhere classified: Secondary | ICD-10-CM | POA: Insufficient documentation

## 2020-09-17 DIAGNOSIS — J069 Acute upper respiratory infection, unspecified: Secondary | ICD-10-CM

## 2020-09-17 DIAGNOSIS — J029 Acute pharyngitis, unspecified: Secondary | ICD-10-CM

## 2020-09-17 HISTORY — DX: Acute upper respiratory infection, unspecified: J06.9

## 2020-09-17 HISTORY — DX: Acute pharyngitis, unspecified: J02.9

## 2020-09-17 MED ORDER — GUAIFENESIN ER 600 MG PO TB12: 600.0000 mg | ORAL_TABLET | Freq: Two times a day (BID) | ORAL | 0 refills | Status: DC | PRN

## 2020-09-17 MED ORDER — AMOXICILLIN 500 MG PO CAPS: 500.0000 mg | ORAL_CAPSULE | Freq: Two times a day (BID) | ORAL | 0 refills | Status: DC

## 2020-09-17 NOTE — Assessment & Plan Note (Signed)
Acute, ongoing since Saturday.  Although at-home test negative, encouraged re-testing.  Reassured patient that symptoms and exam findings are most consistent with a viral upper respiratory infection and explained lack of efficacy of antibiotics against viruses.  Discussed expected course and features suggestive of secondary bacterial infection.  Continue supportive care. Increase fluid intake with water or electrolyte solution like pedialyte. Encouraged acetaminophen as needed for fever/pain. Encouraged salt water gargling, chloraseptic spray and throat lozenges. Encouraged OTC guaifenesin. Encouraged saline sinus flushes and/or neti with humidified air.  Return to clinic if symptoms persist for more than 10 days.  With any sudden onset new chest pain, dizziness, sweating, or shortness of breath, go to ED.

## 2020-09-17 NOTE — Progress Notes (Signed)
Subjective:    Patient ID: Janet Love, female    DOB: Nov 16, 1990, 30 y.o.   MRN: [id number]  HPI: SHAQUILLE MURDY is a 30 y.o. female presenting virtually for sore throat.  Chief Complaint  Patient presents with  . Sore Throat    Pt is having all sx associated to covid although home test taken yesterday was neg. She is asking for medication to be sent to pharmacy. Pt has not ben vaccinated from covid or flu but does plan to be. Pt is also needing note for job. Note sent via mychart stating, pt is return back to wk once sx free   UPPER RESPIRATORY TRACT INFECTION Onset: Saturday Worst symptom: sore throat Fever: no Cough: yes; dry Shortness of breath: yes Wheezing: no Chest pain: no Chest tightness: no Chest congestion: no Nasal congestion: yes Runny nose: yes Post nasal drip: yes Sneezing: yes Sore throat: yes; bilaterally when she swallows Swollen glands: yes Sinus pressure: yes Headache: yes Face pain: no Toothache: no Ear pain: no  Ear pressure: no  Eyes red/itching:no Eye drainage/crusting: yes; eyes have been watering  Nausea: no  Vomiting: no Diarrhea: no  Change in appetite: no  Loss of taste/smell: yes  Rash: no Fatigue: yes Sick contacts: yes; co-workers Strep contacts: kids had strep throat about 2 weeks ago  Context: stable Recurrent sinusitis: no Treatments attempted: Dayquil/Nyquil Relief with OTC medications: yes  No Known Allergies  Outpatient Encounter Medications as of 09/17/2020  Medication Sig  . amoxicillin (AMOXIL) 500 MG capsule Take 1 capsule (500 mg total) by mouth 2 (two) times daily.  Marland Kitchen guaiFENesin (MUCINEX) 600 MG 12 hr tablet Take 1 tablet (600 mg total) by mouth 2 (two) times daily as needed for cough or to loosen phlegm.  Marland Kitchen levonorgestrel (MIRENA, 52 MG,) 20 MCG/24HR IUD Mirena 20 mcg/24 hours (6 yrs) 52 mg intrauterine device  Take 1 device by intrauterine route.  . ondansetron (ZOFRAN ODT) 4 MG disintegrating tablet  Take 1 tablet (4 mg total) by mouth every 8 (eight) hours as needed for nausea or vomiting.  . venlafaxine XR (EFFEXOR-XR) 75 MG 24 hr capsule TAKE 1 CAPSULE(75 MG) BY MOUTH DAILY WITH BREAKFAST. STOP LEXAPRO   No facility-administered encounter medications on file as of 09/17/2020.    Patient Active Problem List   Diagnosis Date Noted  . At risk for fertility problems 09/17/2020  . Upper respiratory tract infection 09/17/2020  . Sore throat 09/17/2020  . IUD (intrauterine device) in place 11/13/2014    Past Medical History:  Diagnosis Date  . Anxiety    Phreesia 04/26/2020  . Hx of varicella     Relevant past medical, surgical, family and social history reviewed and updated as indicated. Interim medical history since our last visit reviewed.  Review of Systems  Constitutional: Positive for activity change and fatigue. Negative for appetite change, chills, diaphoresis and fever.  HENT: Positive for congestion, postnasal drip, rhinorrhea, sinus pressure, sinus pain, sneezing and sore throat. Negative for ear discharge, ear pain, facial swelling, hearing loss and trouble swallowing.   Eyes: Positive for discharge (watery). Negative for pain, redness and itching.  Respiratory: Positive for cough and shortness of breath. Negative for chest tightness and wheezing.   Cardiovascular: Negative.  Negative for chest pain.  Gastrointestinal: Negative.  Negative for diarrhea, nausea and vomiting.  Skin: Negative.  Negative for color change, pallor and rash.  Neurological: Positive for headaches. Negative for dizziness and weakness.  Hematological: Positive for  adenopathy.  Psychiatric/Behavioral: Negative.     Per HPI unless specifically indicated above     Objective:    There were no vitals taken for this visit.  Wt Readings from Last 3 Encounters:  04/26/20 183 lb 3.2 oz (83.1 kg)  09/19/19 183 lb (83 kg)  08/15/19 183 lb (83 kg)    Physical Exam Vitals and nursing note reviewed.   Constitutional:      General: She is not in acute distress.    Appearance: Normal appearance. She is not ill-appearing, toxic-appearing or diaphoretic.  HENT:     Head: Normocephalic and atraumatic.     Right Ear: External ear normal.     Left Ear: External ear normal.     Nose: Nose normal. No congestion or rhinorrhea.     Mouth/Throat:     Mouth: Mucous membranes are moist.     Pharynx: Oropharynx is clear. No oropharyngeal exudate or posterior oropharyngeal erythema.  Eyes:     General: No scleral icterus.       Right eye: No discharge.        Left eye: No discharge.     Extraocular Movements: Extraocular movements intact.  Cardiovascular:     Comments: Unable to assess heart sounds via virtual visit. Pulmonary:     Effort: Pulmonary effort is normal. No respiratory distress.     Comments: Unable to assess lung sounds via virtual visit; patient talking in complete sentences without accessory muscle use.  Skin:    Coloration: Skin is not jaundiced or pale.     Findings: No erythema.  Neurological:     Mental Status: She is alert and oriented to person, place, and time.  Psychiatric:        Mood and Affect: Mood normal.        Behavior: Behavior normal.        Thought Content: Thought content normal.        Judgment: Judgment normal.    Results for orders placed or performed in visit on 04/26/20  SARS-COV-2 RNA,(COVID-19) QUAL NAAT   Specimen: Respiratory  Result Value Ref Range   SARS CoV2 RNA Not Detected Not Detect      Assessment & Plan:   Problem List Items Addressed This Visit      Respiratory   Upper respiratory tract infection - Primary    Acute, ongoing since Saturday.  Although at-home test negative, encouraged re-testing.  Reassured patient that symptoms and exam findings are most consistent with a viral upper respiratory infection and explained lack of efficacy of antibiotics against viruses.  Discussed expected course and features suggestive of secondary  bacterial infection.  Continue supportive care. Increase fluid intake with water or electrolyte solution like pedialyte. Encouraged acetaminophen as needed for fever/pain. Encouraged salt water gargling, chloraseptic spray and throat lozenges. Encouraged OTC guaifenesin. Encouraged saline sinus flushes and/or neti with humidified air.  Return to clinic if symptoms persist for more than 10 days.  With any sudden onset new chest pain, dizziness, sweating, or shortness of breath, go to ED.        Relevant Medications   guaiFENesin (MUCINEX) 600 MG 12 hr tablet     Other   Sore throat    Acute, ongoing since Saturday.  Given children with recent strep infection, and since visit is virtual, cannot rule out strep throat.  Will treat empirically.  Amoxicillin sent into pharmacy.       Relevant Medications   amoxicillin (AMOXIL) 500 MG capsule  Follow up plan: Return if symptoms worsen or fail to improve.  Due to the catastrophic nature of the COVID-19 pandemic, this visit was completed via audio and visual contact via [employer] due to the restrictions of the COVID-19 pandemic. All issues as above were discussed and addressed. Physical exam was done as above through visual confirmation on Caregility. If it was felt that the patient should be evaluated in the office, they were directed there. The patient verbally consented to this visit."} . Location of the patient: home . Location of the provider: work . Those involved with this call:  . Provider: Mardene Celeste, DNP . CMA: Moises Blood, CMA . Front Desk/Registration: Flavia Shipper  . Time spent on call: 15 minutes with patient face to face via video conference. More than 50% of this time was spent in counseling and coordination of care. 25 minutes total spent in review of patient's record and preparation of their chart.  I verified patient identity using two factors (patient name and date of birth). Patient consents verbally to being  seen via telemedicine visit today.

## 2020-09-17 NOTE — Assessment & Plan Note (Addendum)
Acute, ongoing since Saturday.  Given children with recent strep infection, and since visit is virtual, cannot rule out strep throat.  Will treat empirically.  Amoxicillin sent into pharmacy.

## 2020-12-03 ENCOUNTER — Emergency Department (HOSPITAL_COMMUNITY): Payer: Medicaid Other

## 2020-12-03 ENCOUNTER — Encounter (HOSPITAL_COMMUNITY): Payer: Self-pay

## 2020-12-03 ENCOUNTER — Observation Stay (HOSPITAL_COMMUNITY)
Admission: EM | Admit: 2020-12-03 | Discharge: 2020-12-05 | Disposition: A | Discharge status: Home / Self Care | Payer: Medicaid Other | Attending: Surgery | Admitting: Surgery

## 2020-12-03 ENCOUNTER — Other Ambulatory Visit: Payer: Self-pay

## 2020-12-03 DIAGNOSIS — Z975 Presence of (intrauterine) contraceptive device: Secondary | ICD-10-CM | POA: Insufficient documentation

## 2020-12-03 DIAGNOSIS — K3580 Unspecified acute appendicitis: Secondary | ICD-10-CM

## 2020-12-03 DIAGNOSIS — R109 Unspecified abdominal pain: Secondary | ICD-10-CM | POA: Diagnosis not present

## 2020-12-03 DIAGNOSIS — K353 Acute appendicitis with localized peritonitis, without perforation or gangrene: Secondary | ICD-10-CM | POA: Diagnosis not present

## 2020-12-03 DIAGNOSIS — F419 Anxiety disorder, unspecified: Secondary | ICD-10-CM | POA: Insufficient documentation

## 2020-12-03 DIAGNOSIS — Z20822 Contact with and (suspected) exposure to covid-19: Secondary | ICD-10-CM | POA: Diagnosis not present

## 2020-12-03 DIAGNOSIS — R1031 Right lower quadrant pain: Secondary | ICD-10-CM | POA: Diagnosis present

## 2020-12-03 HISTORY — DX: Unspecified acute appendicitis: K35.80

## 2020-12-03 LAB — CBC
HCT: 40 % (ref 36.0–46.0)
Hemoglobin: 13.5 g/dL (ref 12.0–15.0)
MCH: 31 pg (ref 26.0–34.0)
MCHC: 33.8 g/dL (ref 30.0–36.0)
MCV: 92 fL (ref 80.0–100.0)
Platelets: 171 10*3/uL (ref 150–400)
RBC: 4.35 MIL/uL (ref 3.87–5.11)
RDW: 12.8 % (ref 11.5–15.5)
WBC: 20 10*3/uL — ABNORMAL HIGH (ref 4.0–10.5)
nRBC: 0 % (ref 0.0–0.2)

## 2020-12-03 LAB — COMPREHENSIVE METABOLIC PANEL
ALT: 57 U/L — ABNORMAL HIGH (ref 0–44)
AST: 30 U/L (ref 15–41)
Albumin: 4.6 g/dL (ref 3.5–5.0)
Alkaline Phosphatase: 73 U/L (ref 38–126)
Anion gap: 8 (ref 5–15)
BUN: 12 mg/dL (ref 6–20)
CO2: 23 mmol/L (ref 22–32)
Calcium: 9.5 mg/dL (ref 8.9–10.3)
Chloride: 106 mmol/L (ref 98–111)
Creatinine, Ser: 0.89 mg/dL (ref 0.44–1.00)
GFR, Estimated: >60 mL/min (ref >60)
Glucose, Bld: 93 mg/dL (ref 70–99)
Potassium: 3.6 mmol/L (ref 3.5–5.1)
Sodium: 137 mmol/L (ref 135–145)
Total Bilirubin: 0.7 mg/dL (ref 0.3–1.2)
Total Protein: 7.6 g/dL (ref 6.5–8.1)

## 2020-12-03 LAB — URINALYSIS, ROUTINE W REFLEX MICROSCOPIC
Bilirubin Urine: NEGATIVE
Glucose, UA: NEGATIVE mg/dL
Hgb urine dipstick: NEGATIVE
Ketones, ur: 80 mg/dL — AB
Leukocytes,Ua: NEGATIVE
Nitrite: NEGATIVE
Protein, ur: NEGATIVE mg/dL
Specific Gravity, Urine: 1.039 — ABNORMAL HIGH (ref 1.005–1.030)
pH: 5 (ref 5.0–8.0)

## 2020-12-03 LAB — LIPASE, BLOOD: Lipase: 20 U/L (ref 11–51)

## 2020-12-03 LAB — I-STAT BETA HCG BLOOD, ED (MC, WL, AP ONLY): I-stat hCG, quantitative: <5 m[IU]/mL (ref <5)

## 2020-12-03 MED ORDER — ONDANSETRON HCL 4 MG/2ML IJ SOLN
4.0000 mg | Freq: Once | INTRAMUSCULAR | Status: AC
  Administered 2020-12-03: 4 mg via INTRAVENOUS
  Filled 2020-12-03: qty 2

## 2020-12-03 MED ORDER — SODIUM CHLORIDE 0.9 % IV SOLN
2.0000 g | INTRAVENOUS | Status: DC
  Administered 2020-12-04 – 2020-12-05 (×2): 2 g via INTRAVENOUS
  Filled 2020-12-03 (×2): qty 2

## 2020-12-03 MED ORDER — HYDROMORPHONE HCL 1 MG/ML IJ SOLN
1.0000 mg | INTRAMUSCULAR | Status: DC | PRN
  Administered 2020-12-04 (×2): 1 mg via INTRAVENOUS
  Filled 2020-12-03 (×3): qty 1

## 2020-12-03 MED ORDER — ONDANSETRON HCL 4 MG/2ML IJ SOLN
4.0000 mg | Freq: Four times a day (QID) | INTRAMUSCULAR | Status: DC | PRN
  Administered 2020-12-04: 4 mg via INTRAVENOUS
  Filled 2020-12-03: qty 2

## 2020-12-03 MED ORDER — METRONIDAZOLE IN NACL 5-0.79 MG/ML-% IV SOLN
500.0000 mg | Freq: Three times a day (TID) | INTRAVENOUS | Status: DC
  Administered 2020-12-04 – 2020-12-05 (×4): 500 mg via INTRAVENOUS
  Filled 2020-12-03 (×4): qty 100

## 2020-12-03 MED ORDER — ONDANSETRON 4 MG PO TBDP: 4.0000 mg | ORAL_TABLET | Freq: Four times a day (QID) | ORAL | Status: DC | PRN

## 2020-12-03 MED ORDER — BUPIVACAINE-EPINEPHRINE (PF) 0.25% -1:200000 IJ SOLN
INTRAMUSCULAR | Status: AC
  Filled 2020-12-03: qty 30

## 2020-12-03 MED ORDER — KCL IN DEXTROSE-NACL 20-5-0.45 MEQ/L-%-% IV SOLN
INTRAVENOUS | Status: DC
  Filled 2020-12-03 (×3): qty 1000

## 2020-12-03 MED ORDER — HYDROMORPHONE HCL 1 MG/ML IJ SOLN
1.0000 mg | Freq: Once | INTRAMUSCULAR | Status: AC
  Administered 2020-12-03: 1 mg via INTRAVENOUS
  Filled 2020-12-03: qty 1

## 2020-12-03 MED ORDER — OXYCODONE HCL 5 MG PO TABS: 5.0000 mg | ORAL_TABLET | ORAL | Status: DC | PRN

## 2020-12-03 MED ORDER — ACETAMINOPHEN 650 MG RE SUPP: 650.0000 mg | Freq: Four times a day (QID) | RECTAL | Status: DC | PRN

## 2020-12-03 MED ORDER — LIP MEDEX EX OINT
TOPICAL_OINTMENT | CUTANEOUS | Status: AC
  Filled 2020-12-03: qty 7

## 2020-12-03 MED ORDER — PIPERACILLIN-TAZOBACTAM 3.375 G IVPB 30 MIN
3.3750 g | Freq: Once | INTRAVENOUS | Status: AC
  Administered 2020-12-03: 3.375 g via INTRAVENOUS
  Filled 2020-12-03: qty 50

## 2020-12-03 MED ORDER — ACETAMINOPHEN 325 MG PO TABS
650.0000 mg | ORAL_TABLET | Freq: Four times a day (QID) | ORAL | Status: DC | PRN
  Administered 2020-12-04 – 2020-12-05 (×2): 650 mg via ORAL
  Filled 2020-12-03 (×2): qty 2

## 2020-12-03 MED ORDER — IOHEXOL 300 MG/ML  SOLN
100.0000 mL | Freq: Once | INTRAMUSCULAR | Status: AC | PRN
  Administered 2020-12-03: 100 mL via INTRAVENOUS

## 2020-12-03 NOTE — Anesthesia Preprocedure Evaluation (Addendum)
Anesthesia Evaluation  Patient identified by MRN, date of birth, ID band Patient awake    Reviewed: Allergy & Precautions, NPO status , Patient's Chart, lab work & pertinent test results  Airway Mallampati: I  TM Distance: >3 FB Neck ROM: Full    Dental no notable dental hx. (+) Teeth Intact, Dental Advisory Given   Pulmonary neg pulmonary ROS, former smoker,  Covid 19 10/25/19, recovered   Pulmonary exam normal breath sounds clear to auscultation       Cardiovascular negative cardio ROS Normal cardiovascular exam Rhythm:Regular Rate:Normal     Neuro/Psych Anxiety negative neurological ROS  negative psych ROS   GI/Hepatic negative GI ROS, Neg liver ROS, Acute appendicitis   Endo/Other  negative endocrine ROSObesity  Renal/GU negative Renal ROS  negative genitourinary   Musculoskeletal negative musculoskeletal ROS (+)   Abdominal   Peds negative pediatric ROS (+)  Hematology negative hematology ROS (+)   Anesthesia Other Findings   Reproductive/Obstetrics negative OB ROS                           Anesthesia Physical Anesthesia Plan  ASA: II and emergent  Anesthesia Plan: General   Post-op Pain Management:    Induction: Intravenous, Rapid sequence and Cricoid pressure planned  PONV Risk Score and Plan: 4 or greater and Treatment may vary due to age or medical condition and Ondansetron  Airway Management Planned: Oral ETT  Additional Equipment: None  Intra-op Plan:   Post-operative Plan: Extubation in OR  Informed Consent: I have reviewed the patients History and Physical, chart, labs and discussed the procedure including the risks, benefits and alternatives for the proposed anesthesia with the patient or authorized representative who has indicated his/her understanding and acceptance.     Dental advisory given  Plan Discussed with: CRNA and Anesthesiologist  Anesthesia  Plan Comments: (2139 awaiting Covid-19 test)       Anesthesia Quick Evaluation

## 2020-12-03 NOTE — ED Notes (Signed)
ED TO INPATIENT HANDOFF REPORT  Name/Age/Gender Janet Love 30 y.o. female  Code Status    Code Status Orders  (From admission, onward)         Start     Ordered   12/03/20 2257  Full code  Continuous        12/03/20 2259        Code Status History    Date Active Date Inactive Code Status Order ID Comments User Context   03/14/2017 2107 03/16/2017 2014 Full Code [id number]  Marlow Baars, MD Inpatient   03/14/2017 0715 03/14/2017 2107 Full Code [id number]  Marlow Baars, MD Inpatient   Advance Care Planning Activity      Home/SNF/Other Home with husband   Chief Complaint Acute appendicitis [K35.80]  Level of Care/Admitting Diagnosis ED Disposition    ED Disposition Condition Comment   Admit  Hospital Area: [facility] [[id number]]  Level of Care: Med-Surg [16]  Covid Evaluation: Asymptomatic Screening Protocol (No Symptoms)  Diagnosis: Acute appendicitis [211155]  Admitting Physician: CCS, MD [[id number]]  Attending Physician: CCS, MD [[id number]]       Medical History Past Medical History:  Diagnosis Date  . Anxiety    Phreesia 04/26/2020  . Hx of varicella     Allergies No Known Allergies  IV Location/Drains/Wounds Patient Lines/Drains/Airways Status    Active Line/Drains/Airways    Name Placement date Placement time Site Days   Peripheral IV 03/14/17 Left Wrist 03/14/17  0745  Wrist  1360   Peripheral IV 12/03/20 Left Antecubital 12/03/20  1739  Antecubital  less than 1          Labs/Imaging Results for orders placed or performed during the hospital encounter of 12/03/20 (from the past 48 hour(s))  Lipase, blood     Status: None   Collection Time: 12/03/20  4:28 PM  Result Value Ref Range   Lipase 20 11 - 51 U/L    Comment: Performed at [facility], [address] [place], [place] [address]  Comprehensive metabolic panel     Status: Abnormal   Collection Time: 12/03/20  4:28 PM  Result Value Ref Range   Sodium 137  135 - 145 mmol/L   Potassium 3.6 3.5 - 5.1 mmol/L   Chloride 106 98 - 111 mmol/L   CO2 23 22 - 32 mmol/L   Glucose, Bld 93 70 - 99 mg/dL    Comment: Glucose reference range applies only to samples taken after fasting for at least 8 hours.   BUN 12 6 - 20 mg/dL   Creatinine, Ser 2.33 0.44 - 1.00 mg/dL   Calcium 9.5 8.9 - 61.2 mg/dL   Total Protein 7.6 6.5 - 8.1 g/dL   Albumin 4.6 3.5 - 5.0 g/dL   AST 30 15 - 41 U/L   ALT 57 (H) 0 - 44 U/L   Alkaline Phosphatase 73 38 - 126 U/L   Total Bilirubin 0.7 0.3 - 1.2 mg/dL   GFR, Estimated >24 >49 mL/min    Comment: (NOTE) Calculated using the CKD-EPI Creatinine Equation (2021)    Anion gap 8 5 - 15    Comment: Performed at [facility], [address] [place], [place] [address]  CBC     Status: Abnormal   Collection Time: 12/03/20  4:28 PM  Result Value Ref Range   WBC 20.0 (H) 4.0 - 10.5 K/uL   RBC 4.35 3.87 - 5.11 MIL/uL   Hemoglobin 13.5 12.0 - 15.0 g/dL  HCT 40.0 36.0 - 46.0 %   MCV 92.0 80.0 - 100.0 fL   MCH 31.0 26.0 - 34.0 pg   MCHC 33.8 30.0 - 36.0 g/dL   RDW 99.3 71.6 - 96.7 %   Platelets 171 150 - 400 K/uL   nRBC 0.0 0.0 - 0.2 %    Comment: Performed at [facility], [address] [place], [place] [address]  I-Stat beta hCG blood, ED     Status: None   Collection Time: 12/03/20  4:34 PM  Result Value Ref Range   I-stat hCG, quantitative <5.0 <5 mIU/mL   Comment 3            Comment:   GEST. AGE      CONC.  (mIU/mL)   <=1 WEEK        5 - 50     2 WEEKS       50 - 500     3 WEEKS       100 - 10,000     4 WEEKS     1,000 - 30,000        FEMALE AND NON-PREGNANT FEMALE:     LESS THAN 5 mIU/mL   Urinalysis, Routine w reflex microscopic Urine, Clean Catch     Status: Abnormal   Collection Time: 12/03/20  6:58 PM  Result Value Ref Range   Color, Urine YELLOW YELLOW   APPearance CLEAR CLEAR   Specific Gravity, Urine 1.039 (H) 1.005 - 1.030   pH 5.0 5.0 - 8.0   Glucose, UA  NEGATIVE NEGATIVE mg/dL   Hgb urine dipstick NEGATIVE NEGATIVE   Bilirubin Urine NEGATIVE NEGATIVE   Ketones, ur 80 (A) NEGATIVE mg/dL   Protein, ur NEGATIVE NEGATIVE mg/dL   Nitrite NEGATIVE NEGATIVE   Leukocytes,Ua NEGATIVE NEGATIVE    Comment: Performed at [facility], [address] [place], [place] [address]   CT Abdomen Pelvis W Contrast  Result Date: 12/03/2020 CLINICAL DATA:  Right lower quadrant abdominal pain. EXAM: CT ABDOMEN AND PELVIS WITH CONTRAST TECHNIQUE: Multidetector CT imaging of the abdomen and pelvis was performed using the standard protocol following bolus administration of intravenous contrast. CONTRAST:  OMNIPAQUE IOHEXOL 300 MG/ML  SOLN COMPARISON:  November 24, 2011. FINDINGS: Lower chest: No acute abnormality. Hepatobiliary: No focal liver abnormality is seen. No gallstones, gallbladder wall thickening, or biliary dilatation. Pancreas: Unremarkable. No pancreatic ductal dilatation or surrounding inflammatory changes. Spleen: Normal in size without focal abnormality. Adrenals/Urinary Tract: Adrenal glands are unremarkable. Kidneys are normal, without renal calculi, focal lesion, or hydronephrosis. Bladder is unremarkable. Stomach/Bowel: The stomach appears normal. There is no evidence of bowel obstruction. The appendix is enlarged with surrounding inflammation consistent with acute appendicitis. Appendix: Location: Right lower quadrant. Diameter: 13 mm. Appendicolith: Yes. Mucosal hyper-enhancement: Yes. Extraluminal gas: No. Periappendiceal collection: No. Vascular/Lymphatic: No significant vascular findings are present. No enlarged abdominal or pelvic lymph nodes. Reproductive: Intrauterine device is noted. No adnexal abnormality is noted. Other: No abdominal wall hernia or abnormality. No abdominopelvic ascites. Musculoskeletal: No acute or significant osseous findings. IMPRESSION: Findings consistent with acute appendicitis. No abscess is noted.  Electronically Signed   By: Lupita Raider M.D.   On: 12/03/2020 19:13    Pending Labs Unresulted Labs (From admission, onward)          Start     Ordered   12/03/20 2257  HIV Antibody (routine testing w rflx)  (HIV Antibody (Routine testing w reflex) panel)  Once,   STAT        12/03/20 2259   12/03/20 2034  SARS CORONAVIRUS 2 (TAT 6-24 HRS) Nasopharyngeal Nasopharyngeal Swab  (Tier 3 - Symptomatic/asymptomatic with Precautions)  Once,   STAT       Question Answer Comment  Is this test for diagnosis or screening Screening   Symptomatic for COVID-19 as defined by CDC No   Hospitalized for COVID-19 No   Admitted to ICU for COVID-19 No   Previously tested for COVID-19 Yes   Resident in a congregate (group) care setting No   Employed in healthcare setting No   Pregnant No   Has patient completed COVID vaccination(s) (2 doses of Pfizer/Moderna 1 dose of [employer]) Unknown      12/03/20 2033          Vitals/Pain Today's Vitals   12/03/20 1908 12/03/20 1912 12/03/20 2044 12/03/20 2230  BP: 126/89  116/63 (!) 144/92  Pulse: 80  85 76  Resp: 18  18 16   Temp: 98.3 F (36.8 C)  98.5 F (36.9 C)   TempSrc: Oral  Oral   SpO2: 98%  100% 99%  Weight:      Height:      PainSc:  8       Isolation Precautions Airborne and Contact precautions  Medications Medications  dextrose 5 % and 0.45 % NaCl with KCl 20 mEq/L infusion (has no administration in time range)  cefTRIAXone (ROCEPHIN) 2 g in sodium chloride 0.9 % 100 mL IVPB (has no administration in time range)    And  metroNIDAZOLE (FLAGYL) IVPB 500 mg (has no administration in time range)  acetaminophen (TYLENOL) tablet 650 mg (has no administration in time range)    Or  acetaminophen (TYLENOL) suppository 650 mg (has no administration in time range)  oxyCODONE (Oxy IR/ROXICODONE) immediate release tablet 5-10 mg (has no administration in time range)  HYDROmorphone (DILAUDID) injection 1 mg (has no administration in  time range)  ondansetron (ZOFRAN-ODT) disintegrating tablet 4 mg (has no administration in time range)    Or  ondansetron (ZOFRAN) injection 4 mg (has no administration in time range)  HYDROmorphone (DILAUDID) injection 1 mg (1 mg Intravenous Given 12/03/20 1810)  ondansetron (ZOFRAN) injection 4 mg (4 mg Intravenous Given 12/03/20 1808)  iohexol (OMNIPAQUE) 300 MG/ML solution 100 mL (100 mLs Intravenous Contrast Given 12/03/20 1831)  piperacillin-tazobactam (ZOSYN) IVPB 3.375 g (0 g Intravenous Stopped 12/03/20 2102)    Mobility Ambulatory

## 2020-12-03 NOTE — Progress Notes (Signed)
General Surgery [facility]  Covid test ordered by ER physician requires 6-24 hours for results.  Pending at this time.  OR will proceed with other urgent cases at this time.  Will notify patient through ER team and plan appendectomy tomorrow morning with Dr. Phylliss Blakes.  Darnell Level, MD [facility] Office: [phone]

## 2020-12-03 NOTE — H&P (Signed)
Janet Love is an 30 y.o. female.    General Surgery [facility]  Chief Complaint: abdominal pain, acute appendicitis   HPI: Patient is a 30 year old female who developed sudden onset of right lower quadrant abdominal pain at approximately 2 PM today while at work.  Patient denies fevers or chills.  She denies nausea or vomiting.  She was seen and evaluated at urgent care and referred to the emergency department for CT scan.  Laboratory studies demonstrated an elevated white blood cell count of 20,000.  CT scan was consistent with acute appendicitis.  General surgery is called for management.  Patient has had no prior abdominal surgery.  She works in the [employer] at [employer] in [employer].  She is married with 2 children.  Past Medical History:  Diagnosis Date  . Anxiety    Phreesia 04/26/2020  . Hx of varicella     Past Surgical History:  Procedure Laterality Date  . NO PAST SURGERIES      Family History  Problem Relation Age of Onset  . Hypertension Mother   . Hypertension Father   . Hypertension Brother   . Cancer Maternal Grandmother        non-hodgkins lymphoma  . Cancer Paternal Grandmother        cervical  . Diabetes Paternal Grandmother   . Other Neg Hx    Social History:  reports that she quit smoking about 12 years ago. She has never used smokeless tobacco. She reports that she does not drink alcohol and does not use drugs.  Allergies: No Known Allergies  (Not in a hospital admission)   Results for orders placed or performed during the hospital encounter of 12/03/20 (from the past 48 hour(s))  Lipase, blood     Status: None   Collection Time: 12/03/20  4:28 PM  Result Value Ref Range   Lipase 20 11 - 51 U/L    Comment: Performed at [facility], [address], [place], [place] [address]  Comprehensive metabolic panel     Status: Abnormal   Collection Time: 12/03/20  4:28 PM  Result Value Ref Range    Sodium 137 135 - 145 mmol/L   Potassium 3.6 3.5 - 5.1 mmol/L   Chloride 106 98 - 111 mmol/L   CO2 23 22 - 32 mmol/L   Glucose, Bld 93 70 - 99 mg/dL    Comment: Glucose reference range applies only to samples taken after fasting for at least 8 hours.   BUN 12 6 - 20 mg/dL   Creatinine, Ser 4.32 0.44 - 1.00 mg/dL   Calcium 9.5 8.9 - 76.1 mg/dL   Total Protein 7.6 6.5 - 8.1 g/dL   Albumin 4.6 3.5 - 5.0 g/dL   AST 30 15 - 41 U/L   ALT 57 (H) 0 - 44 U/L   Alkaline Phosphatase 73 38 - 126 U/L   Total Bilirubin 0.7 0.3 - 1.2 mg/dL   GFR, Estimated >47 >09 mL/min    Comment: (NOTE) Calculated using the CKD-EPI Creatinine Equation (2021)    Anion gap 8 5 - 15    Comment: Performed at [facility], [address], [place], [place] [address]  CBC     Status: Abnormal   Collection Time: 12/03/20  4:28 PM  Result Value Ref Range   WBC 20.0 (H) 4.0 - 10.5 K/uL   RBC 4.35 3.87 - 5.11 MIL/uL   Hemoglobin 13.5 12.0 - 15.0 g/dL  HCT 40.0 36.0 - 46.0 %   MCV 92.0 80.0 - 100.0 fL   MCH 31.0 26.0 - 34.0 pg   MCHC 33.8 30.0 - 36.0 g/dL   RDW 62.8 31.5 - 17.6 %   Platelets 171 150 - 400 K/uL   nRBC 0.0 0.0 - 0.2 %    Comment: Performed at [facility], [address], [place], [place] [address]  I-Stat beta hCG blood, ED     Status: None   Collection Time: 12/03/20  4:34 PM  Result Value Ref Range   I-stat hCG, quantitative <5.0 <5 mIU/mL   Comment 3            Comment:   GEST. AGE      CONC.  (mIU/mL)   <=1 WEEK        5 - 50     2 WEEKS       50 - 500     3 WEEKS       100 - 10,000     4 WEEKS     1,000 - 30,000        FEMALE AND NON-PREGNANT FEMALE:     LESS THAN 5 mIU/mL   Urinalysis, Routine w reflex microscopic Urine, Clean Catch     Status: Abnormal   Collection Time: 12/03/20  6:58 PM  Result Value Ref Range   Color, Urine YELLOW YELLOW   APPearance CLEAR CLEAR   Specific Gravity, Urine 1.039 (H) 1.005 - 1.030   pH 5.0 5.0 - 8.0   Glucose,  UA NEGATIVE NEGATIVE mg/dL   Hgb urine dipstick NEGATIVE NEGATIVE   Bilirubin Urine NEGATIVE NEGATIVE   Ketones, ur 80 (A) NEGATIVE mg/dL   Protein, ur NEGATIVE NEGATIVE mg/dL   Nitrite NEGATIVE NEGATIVE   Leukocytes,Ua NEGATIVE NEGATIVE    Comment: Performed at [facility], [address], [place], [place] [address]   CT Abdomen Pelvis W Contrast  Result Date: 12/03/2020 CLINICAL DATA:  Right lower quadrant abdominal pain. EXAM: CT ABDOMEN AND PELVIS WITH CONTRAST TECHNIQUE: Multidetector CT imaging of the abdomen and pelvis was performed using the standard protocol following bolus administration of intravenous contrast. CONTRAST:  OMNIPAQUE IOHEXOL 300 MG/ML  SOLN COMPARISON:  November 24, 2011. FINDINGS: Lower chest: No acute abnormality. Hepatobiliary: No focal liver abnormality is seen. No gallstones, gallbladder wall thickening, or biliary dilatation. Pancreas: Unremarkable. No pancreatic ductal dilatation or surrounding inflammatory changes. Spleen: Normal in size without focal abnormality. Adrenals/Urinary Tract: Adrenal glands are unremarkable. Kidneys are normal, without renal calculi, focal lesion, or hydronephrosis. Bladder is unremarkable. Stomach/Bowel: The stomach appears normal. There is no evidence of bowel obstruction. The appendix is enlarged with surrounding inflammation consistent with acute appendicitis. Appendix: Location: Right lower quadrant. Diameter: 13 mm. Appendicolith: Yes. Mucosal hyper-enhancement: Yes. Extraluminal gas: No. Periappendiceal collection: No. Vascular/Lymphatic: No significant vascular findings are present. No enlarged abdominal or pelvic lymph nodes. Reproductive: Intrauterine device is noted. No adnexal abnormality is noted. Other: No abdominal wall hernia or abnormality. No abdominopelvic ascites. Musculoskeletal: No acute or significant osseous findings. IMPRESSION: Findings consistent with acute appendicitis. No abscess is noted.  Electronically Signed   By: Lupita Raider M.D.   On: 12/03/2020 19:13    Review of Systems  Constitutional: Negative.   HENT: Negative.   Eyes: Negative.   Respiratory: Negative.   Cardiovascular: Negative.   Gastrointestinal: Positive for abdominal pain.  Endocrine: Negative.   Genitourinary: Negative.   Musculoskeletal: Negative.  Skin: Negative.   Allergic/Immunologic: Negative.   Neurological: Negative.   Hematological: Negative.   Psychiatric/Behavioral: Negative.     Physical Exam   Blood pressure 126/89, pulse 80, temperature 98.3 F (36.8 C), temperature source Oral, resp. rate 18, height 5\' 2"  (1.575 m), weight 88.9 kg, last menstrual period 12/03/2020, SpO2 98 %, unknown if currently breastfeeding.  CONSTITUTIONAL: no acute distress; conversant; no obvious deformities  EYES: conjunctiva moist; no lid lag; anicteric; pupils equal bilaterally  NECK: trachea midline; no thyroid nodularity  LUNGS: respiratory effort normal & unlabored; no wheeze; no rales; no tactile fremitus  CV: rate and rhythm regular; no palpable thrills; no murmur; no edema bilat lower extremities  GI: abdomen is soft without distention; no hepatosplenomegaly; no obvious hernia; moderate tenderness to palpation right lower quadrant with voluntary guarding; referred tenderness to right lower quadrant with palpation on the left side of the abdomen (Rovsing's sign)  MSK: normal range of motion of extremities; no clubbing; no cyanosis  PSYCH: appropriate affect for situation; alert and oriented to person, place, & time  LYMPHATIC: no palpable cervical lymphadenopathy; no evidence lymphedema in extremities   Assessment/Plan Acute appendicitis  Patient will be admitted to the general surgery service.  I have spoken with the emergency room physician and she will start intravenous antibiotics.  The patient will also need to have a Covid test prior to any anesthesia.  The emergency room  will order that study now.  Operating room knows about this patient and we will try to get her into the operating room later this evening if possible.  I discussed the indications for appendectomy with the patient.  We discussed laparoscopic appendectomy.  We discussed the possibility of open surgery.  We discussed her anticipated hospital course and her postoperative recovery.  She understands and wishes to proceed.  12/05/2020, MD [facility] Office: [facility]    [phone], MD 12/03/2020, 8:41 PM

## 2020-12-03 NOTE — ED Provider Notes (Signed)
[facility]-EMERGENCY DEPT Provider Note   CSN: [id number] Arrival date & time: 12/03/20  1613     History Chief Complaint  Patient presents with  . Abdominal Pain  . Nausea  . Diarrhea    Janet Love is a 30 y.o. female.  The history is provided by the patient.  Abdominal Pain Pain location:  RLQ Pain quality: stabbing   Pain radiates to:  Does not radiate Pain severity:  Severe Onset quality:  Sudden Duration:  4 hours Timing:  Constant Progression:  Worsening Chronicity:  New Context: not previous surgeries, not sick contacts and not suspicious food intake   Relieved by:  Nothing Worsened by:  Nothing Ineffective treatments:  None tried Associated symptoms: diarrhea and nausea   Associated symptoms: no chest pain, no chills, no cough, no dysuria, no fever, no hematuria, no shortness of breath, no sore throat, no vaginal bleeding, no vaginal discharge and no vomiting   Diarrhea Associated symptoms: abdominal pain   Associated symptoms: no arthralgias, no chills, no fever and no vomiting        Past Medical History:  Diagnosis Date  . Anxiety    Phreesia 04/26/2020  . Hx of varicella     Patient Active Problem List   Diagnosis Date Noted  . At risk for fertility problems 09/17/2020  . Upper respiratory tract infection 09/17/2020  . Sore throat 09/17/2020  . IUD (intrauterine device) in place 11/13/2014    Past Surgical History:  Procedure Laterality Date  . NO PAST SURGERIES       OB History    Gravida  2   Para  2   Term  2   Preterm  0   AB  0   Living  2     SAB  0   IAB  0   Ectopic  0   Multiple  0   Live Births  2           Family History  Problem Relation Age of Onset  . Hypertension Mother   . Hypertension Father   . Hypertension Brother   . Cancer Maternal Grandmother        non-hodgkins lymphoma  . Cancer Paternal Grandmother        cervical  . Diabetes Paternal Grandmother   .  Other Neg Hx     Social History   Tobacco Use  . Smoking status: Former Smoker    Quit date: 09/28/2008    Years since quitting: 12.1  . Smokeless tobacco: Never Used  Vaping Use  . Vaping Use: Never used  Substance Use Topics  . Alcohol use: No  . Drug use: No    Home Medications Prior to Admission medications   Medication Sig Start Date End Date Taking? Authorizing Provider  amoxicillin (AMOXIL) 500 MG capsule Take 1 capsule (500 mg total) by mouth 2 (two) times daily. 09/17/20   Valentino Nose, NP  guaiFENesin (MUCINEX) 600 MG 12 hr tablet Take 1 tablet (600 mg total) by mouth 2 (two) times daily as needed for cough or to loosen phlegm. 09/17/20   Valentino Nose, NP  levonorgestrel (MIRENA, 52 MG,) 20 MCG/24HR IUD Mirena 20 mcg/24 hours (6 yrs) 52 mg intrauterine device  Take 1 device by intrauterine route.    [provider]  ondansetron (ZOFRAN ODT) 4 MG disintegrating tablet Take 1 tablet (4 mg total) by mouth every 8 (eight) hours as needed for nausea or vomiting. 04/26/20  Jenne Pane, Crystal A, FNP  venlafaxine XR (EFFEXOR-XR) 75 MG 24 hr capsule TAKE 1 CAPSULE(75 MG) BY MOUTH DAILY WITH BREAKFAST. STOP LEXAPRO 06/04/20   Donita Brooks, MD    Allergies    Patient has no known allergies.  Review of Systems   Review of Systems  Constitutional: Negative for chills and fever.  HENT: Negative for ear pain and sore throat.   Eyes: Negative for pain and visual disturbance.  Respiratory: Negative for cough and shortness of breath.   Cardiovascular: Negative for chest pain and palpitations.  Gastrointestinal: Positive for abdominal pain, diarrhea and nausea. Negative for vomiting.  Genitourinary: Negative for dysuria, hematuria, vaginal bleeding and vaginal discharge.  Musculoskeletal: Negative for arthralgias and back pain.  Skin: Negative for color change and rash.  Neurological: Negative for seizures and syncope.  All other systems reviewed and are  negative.   Physical Exam Updated Vital Signs BP (!) 158/107 (BP Location: Left Arm)   Pulse 92   Temp 98.4 F (36.9 C) (Oral)   Resp 14   Ht 5\' 2"  (1.575 m)   Wt 88.9 kg   LMP 12/03/2020   SpO2 100%   BMI 35.85 kg/m   Physical Exam Vitals and nursing note reviewed.  HENT:     Head: Normocephalic and atraumatic.  Eyes:     General: No scleral icterus. Pulmonary:     Effort: Pulmonary effort is normal. No respiratory distress.  Abdominal:     Palpations: Abdomen is soft.     Tenderness: There is abdominal tenderness in the right lower quadrant. There is guarding. Positive signs include McBurney's sign.  Musculoskeletal:     Cervical back: Normal range of motion.  Skin:    General: Skin is warm and dry.  Neurological:     General: No focal deficit present.     Mental Status: She is alert.  Psychiatric:        Mood and Affect: Mood normal.     ED Results / Procedures / Treatments   Labs (all labs ordered are listed, but only abnormal results are displayed) Labs Reviewed  COMPREHENSIVE METABOLIC PANEL - Abnormal; Notable for the following components:      Result Value   ALT 57 (*)    All other components within normal limits  CBC - Abnormal; Notable for the following components:   WBC 20.0 (*)    All other components within normal limits  URINALYSIS, ROUTINE W REFLEX MICROSCOPIC - Abnormal; Notable for the following components:   Specific Gravity, Urine 1.039 (*)    Ketones, ur 80 (*)    All other components within normal limits  SARS CORONAVIRUS 2 (TAT 6-24 HRS)  LIPASE, BLOOD  I-STAT BETA HCG BLOOD, ED ([facility], [facility], [facility] ONLY)    EKG None  Radiology CT Abdomen Pelvis W Contrast  Result Date: 12/03/2020 CLINICAL DATA:  Right lower quadrant abdominal pain. EXAM: CT ABDOMEN AND PELVIS WITH CONTRAST TECHNIQUE: Multidetector CT imaging of the abdomen and pelvis was performed using the standard protocol following bolus administration of intravenous contrast.  CONTRAST:  12/05/2020 OMNIPAQUE IOHEXOL 300 MG/ML  SOLN COMPARISON:  November 24, 2011. FINDINGS: Lower chest: No acute abnormality. Hepatobiliary: No focal liver abnormality is seen. No gallstones, gallbladder wall thickening, or biliary dilatation. Pancreas: Unremarkable. No pancreatic ductal dilatation or surrounding inflammatory changes. Spleen: Normal in size without focal abnormality. Adrenals/Urinary Tract: Adrenal glands are unremarkable. Kidneys are normal, without renal calculi, focal lesion, or hydronephrosis. Bladder is unremarkable. Stomach/Bowel: The stomach  appears normal. There is no evidence of bowel obstruction. The appendix is enlarged with surrounding inflammation consistent with acute appendicitis. Appendix: Location: Right lower quadrant. Diameter: 13 mm. Appendicolith: Yes. Mucosal hyper-enhancement: Yes. Extraluminal gas: No. Periappendiceal collection: No. Vascular/Lymphatic: No significant vascular findings are present. No enlarged abdominal or pelvic lymph nodes. Reproductive: Intrauterine device is noted. No adnexal abnormality is noted. Other: No abdominal wall hernia or abnormality. No abdominopelvic ascites. Musculoskeletal: No acute or significant osseous findings. IMPRESSION: Findings consistent with acute appendicitis. No abscess is noted. Electronically Signed   By: Lupita Raider M.D.   On: 12/03/2020 19:13    Procedures Procedures   Medications Ordered in ED Medications  HYDROmorphone (DILAUDID) injection 1 mg (has no administration in time range)  ondansetron (ZOFRAN) injection 4 mg (has no administration in time range)    ED Course  I have reviewed the triage vital signs and the nursing notes.  Pertinent labs & imaging results that were available during my care of the patient were reviewed by me and considered in my medical decision making (see chart for details).  Clinical Course as of 12/03/20 2024  Mon Dec 03, 2020  2017 Dr. Georgana Curio was notified of the patient.   [AW]    Clinical Course User Index [AW] Koleen Distance, MD   MDM Rules/Calculators/A&P                          Janet Love presented upon referral from the urgent care with symptoms of appendicitis. She was found to have the same and will be admitted for surgery. Final Clinical Impression(s) / ED Diagnoses Final diagnoses:  Acute appendicitis with localized peritonitis, without perforation, abscess, or gangrene    Rx / DC Orders ED Discharge Orders    None       Koleen Distance, MD 12/03/20 2237

## 2020-12-03 NOTE — ED Triage Notes (Addendum)
Patient c/o RLQ pain, nausea and diarrhea that started today. Patient went to UC and states she was treated for nausea. Patient was instructed to come to the ED to r/o appendicitis.

## 2020-12-04 ENCOUNTER — Encounter (HOSPITAL_COMMUNITY): Payer: Self-pay

## 2020-12-04 ENCOUNTER — Observation Stay (HOSPITAL_COMMUNITY): Payer: Medicaid Other | Admitting: Certified Registered Nurse Anesthetist

## 2020-12-04 ENCOUNTER — Encounter (HOSPITAL_COMMUNITY)
Admission: EM | Disposition: A | Discharge status: Home / Self Care | Payer: Self-pay | Source: Home / Self Care | Attending: Emergency Medicine

## 2020-12-04 DIAGNOSIS — Z975 Presence of (intrauterine) contraceptive device: Secondary | ICD-10-CM | POA: Diagnosis not present

## 2020-12-04 DIAGNOSIS — F419 Anxiety disorder, unspecified: Secondary | ICD-10-CM | POA: Diagnosis not present

## 2020-12-04 DIAGNOSIS — Z20822 Contact with and (suspected) exposure to covid-19: Secondary | ICD-10-CM | POA: Diagnosis not present

## 2020-12-04 DIAGNOSIS — K3580 Unspecified acute appendicitis: Secondary | ICD-10-CM | POA: Diagnosis not present

## 2020-12-04 DIAGNOSIS — K353 Acute appendicitis with localized peritonitis, without perforation or gangrene: Secondary | ICD-10-CM | POA: Diagnosis not present

## 2020-12-04 HISTORY — PX: LAPAROSCOPIC APPENDECTOMY: SHX408

## 2020-12-04 LAB — SARS CORONAVIRUS 2 (TAT 6-24 HRS): SARS Coronavirus 2: NEGATIVE

## 2020-12-04 LAB — HIV ANTIBODY (ROUTINE TESTING W REFLEX): HIV Screen 4th Generation wRfx: NONREACTIVE

## 2020-12-04 LAB — SURGICAL PCR SCREEN
MRSA, PCR: NEGATIVE
Staphylococcus aureus: NEGATIVE

## 2020-12-04 SURGERY — TOTAL HIP REVISION
Anesthesia: General | Site: Hip | Laterality: Left

## 2020-12-04 SURGERY — APPENDECTOMY, LAPAROSCOPIC
Anesthesia: General | Site: Abdomen

## 2020-12-04 MED ORDER — SUCCINYLCHOLINE CHLORIDE 200 MG/10ML IV SOSY
PREFILLED_SYRINGE | INTRAVENOUS | Status: DC | PRN
  Administered 2020-12-04: 140 mg via INTRAVENOUS

## 2020-12-04 MED ORDER — LIDOCAINE 2% (20 MG/ML) 5 ML SYRINGE
INTRAMUSCULAR | Status: DC | PRN
  Administered 2020-12-04: 60 mg via INTRAVENOUS

## 2020-12-04 MED ORDER — BUPIVACAINE-EPINEPHRINE (PF) 0.25% -1:200000 IJ SOLN
INTRAMUSCULAR | Status: AC
  Filled 2020-12-04: qty 30

## 2020-12-04 MED ORDER — ONDANSETRON HCL 4 MG/2ML IJ SOLN
INTRAMUSCULAR | Status: AC
  Filled 2020-12-04: qty 2

## 2020-12-04 MED ORDER — 0.9 % SODIUM CHLORIDE (POUR BTL) OPTIME
TOPICAL | Status: DC | PRN
  Administered 2020-12-04: 1000 mL

## 2020-12-04 MED ORDER — MIDAZOLAM HCL 2 MG/2ML IJ SOLN
INTRAMUSCULAR | Status: AC
  Filled 2020-12-04: qty 2

## 2020-12-04 MED ORDER — DEXAMETHASONE SODIUM PHOSPHATE 10 MG/ML IJ SOLN
INTRAMUSCULAR | Status: DC | PRN
  Administered 2020-12-04: 10 mg via INTRAVENOUS

## 2020-12-04 MED ORDER — ROCURONIUM BROMIDE 10 MG/ML (PF) SYRINGE
PREFILLED_SYRINGE | INTRAVENOUS | Status: AC
  Filled 2020-12-04: qty 10

## 2020-12-04 MED ORDER — TRAMADOL HCL 50 MG PO TABS
50.0000 mg | ORAL_TABLET | Freq: Four times a day (QID) | ORAL | Status: DC | PRN
  Administered 2020-12-04 – 2020-12-05 (×3): 50 mg via ORAL
  Filled 2020-12-04 (×3): qty 1

## 2020-12-04 MED ORDER — ROCURONIUM BROMIDE 10 MG/ML (PF) SYRINGE
PREFILLED_SYRINGE | INTRAVENOUS | Status: DC | PRN
  Administered 2020-12-04: 40 mg via INTRAVENOUS

## 2020-12-04 MED ORDER — ONDANSETRON HCL 4 MG/2ML IJ SOLN
INTRAMUSCULAR | Status: DC | PRN
  Administered 2020-12-04: 4 mg via INTRAVENOUS

## 2020-12-04 MED ORDER — BUPIVACAINE-EPINEPHRINE 0.25% -1:200000 IJ SOLN
INTRAMUSCULAR | Status: DC | PRN
  Administered 2020-12-04: 30 mL

## 2020-12-04 MED ORDER — ACETAMINOPHEN 325 MG PO TABS
325.0000 mg | ORAL_TABLET | ORAL | Status: DC | PRN
Start: 2020-12-04 — End: 2020-12-04

## 2020-12-04 MED ORDER — DEXAMETHASONE SODIUM PHOSPHATE 10 MG/ML IJ SOLN
INTRAMUSCULAR | Status: AC
  Filled 2020-12-04: qty 1

## 2020-12-04 MED ORDER — HYDROMORPHONE HCL 1 MG/ML IJ SOLN: 1.0000 mg | INTRAMUSCULAR | Status: DC | PRN

## 2020-12-04 MED ORDER — FENTANYL CITRATE (PF) 100 MCG/2ML IJ SOLN
25.0000 ug | INTRAMUSCULAR | Status: DC | PRN
Start: 2020-12-04 — End: 2020-12-04

## 2020-12-04 MED ORDER — ACETAMINOPHEN 160 MG/5ML PO SOLN: 325.0000 mg | ORAL | Status: DC | PRN

## 2020-12-04 MED ORDER — FENTANYL CITRATE (PF) 250 MCG/5ML IJ SOLN
INTRAMUSCULAR | Status: AC
  Filled 2020-12-04: qty 5

## 2020-12-04 MED ORDER — LIDOCAINE 2% (20 MG/ML) 5 ML SYRINGE
INTRAMUSCULAR | Status: AC
  Filled 2020-12-04: qty 5

## 2020-12-04 MED ORDER — TRAMADOL HCL 50 MG PO TABS: 50.0000 mg | ORAL_TABLET | Freq: Four times a day (QID) | ORAL | Status: DC | PRN

## 2020-12-04 MED ORDER — OXYCODONE HCL 5 MG PO TABS
5.0000 mg | ORAL_TABLET | Freq: Once | ORAL | Status: DC | PRN
Start: 2020-12-04 — End: 2020-12-04

## 2020-12-04 MED ORDER — OXYCODONE HCL 5 MG PO TABS: 5.0000 mg | ORAL_TABLET | ORAL | Status: DC | PRN

## 2020-12-04 MED ORDER — LACTATED RINGERS IV SOLN: INTRAVENOUS | Status: DC

## 2020-12-04 MED ORDER — MIDAZOLAM HCL 5 MG/5ML IJ SOLN
INTRAMUSCULAR | Status: DC | PRN
  Administered 2020-12-04: 2 mg via INTRAVENOUS

## 2020-12-04 MED ORDER — PROPOFOL 10 MG/ML IV BOLUS
INTRAVENOUS | Status: DC | PRN
  Administered 2020-12-04: 200 mg via INTRAVENOUS

## 2020-12-04 MED ORDER — PROPOFOL 10 MG/ML IV BOLUS
INTRAVENOUS | Status: AC
  Filled 2020-12-04: qty 40

## 2020-12-04 MED ORDER — OXYCODONE HCL 5 MG/5ML PO SOLN
5.0000 mg | Freq: Once | ORAL | Status: DC | PRN
Start: 2020-12-04 — End: 2020-12-04

## 2020-12-04 MED ORDER — FENTANYL CITRATE (PF) 250 MCG/5ML IJ SOLN
INTRAMUSCULAR | Status: DC | PRN
  Administered 2020-12-04: 100 ug via INTRAVENOUS
  Administered 2020-12-04 (×3): 50 ug via INTRAVENOUS

## 2020-12-04 MED ORDER — LACTATED RINGERS IR SOLN
Status: DC | PRN
  Administered 2020-12-04: 1000 mL

## 2020-12-04 MED ORDER — ONDANSETRON HCL 4 MG/2ML IJ SOLN: 4.0000 mg | Freq: Once | INTRAMUSCULAR | Status: DC | PRN

## 2020-12-04 MED ORDER — DIPHENHYDRAMINE HCL 50 MG/ML IJ SOLN: 12.5000 mg | Freq: Four times a day (QID) | INTRAMUSCULAR | Status: DC | PRN

## 2020-12-04 MED ORDER — SUGAMMADEX SODIUM 200 MG/2ML IV SOLN
INTRAVENOUS | Status: DC | PRN
  Administered 2020-12-04: 200 mg via INTRAVENOUS

## 2020-12-04 MED ORDER — SUCCINYLCHOLINE CHLORIDE 200 MG/10ML IV SOSY
PREFILLED_SYRINGE | INTRAVENOUS | Status: AC
  Filled 2020-12-04: qty 10

## 2020-12-04 MED ORDER — MEPERIDINE HCL 50 MG/ML IJ SOLN: 6.2500 mg | INTRAMUSCULAR | Status: DC | PRN

## 2020-12-04 SURGICAL SUPPLY — 51 items
APPLIER CLIP 5 13 M/L LIGAMAX5 (MISCELLANEOUS)
APPLIER CLIP ROT 10 11.4 M/L (STAPLE)
CABLE HIGH FREQUENCY MONO STRZ (ELECTRODE) ×2 IMPLANT
CHLORAPREP W/TINT 26 (MISCELLANEOUS) ×4 IMPLANT
CLIP APPLIE 5 13 M/L LIGAMAX5 (MISCELLANEOUS) IMPLANT
CLIP APPLIE ROT 10 11.4 M/L (STAPLE) IMPLANT
COVER SURGICAL LIGHT HANDLE (MISCELLANEOUS) ×4 IMPLANT
COVER WAND RF STERILE (DRAPES) IMPLANT
CUTTER FLEX LINEAR 45M (STAPLE) IMPLANT
DECANTER SPIKE VIAL GLASS SM (MISCELLANEOUS) ×4 IMPLANT
DERMABOND ADVANCED (GAUZE/BANDAGES/DRESSINGS) ×2
DERMABOND ADVANCED .7 DNX12 (GAUZE/BANDAGES/DRESSINGS) ×2 IMPLANT
DRAIN CHANNEL 19F RND (DRAIN) IMPLANT
DRAPE LAPAROSCOPIC ABDOMINAL (DRAPES) ×2 IMPLANT
ELECT REM PT RETURN 15FT ADLT (MISCELLANEOUS) ×4 IMPLANT
ENDOLOOP SUT PDS II  0 18 (SUTURE)
ENDOLOOP SUT PDS II 0 18 (SUTURE) IMPLANT
EVACUATOR SILICONE 100CC (DRAIN) IMPLANT
GLOVE SURG ENC MOIS LTX SZ6 (GLOVE) ×2 IMPLANT
GLOVE SURG ORTHO LTX SZ8 (GLOVE) ×2 IMPLANT
GLOVE SURG UNDER LTX SZ6.5 (GLOVE) ×2 IMPLANT
GOWN STRL REUS W/ TWL LRG LVL3 (GOWN DISPOSABLE) ×1 IMPLANT
GOWN STRL REUS W/TWL LRG LVL3 (GOWN DISPOSABLE) ×1
GOWN STRL REUS W/TWL XL LVL3 (GOWN DISPOSABLE) ×6 IMPLANT
GRASPER SUT TROCAR 14GX15 (MISCELLANEOUS) IMPLANT
IRRIG SUCT STRYKERFLOW 2 WTIP (MISCELLANEOUS) ×2
IRRIGATION SUCT STRKRFLW 2 WTP (MISCELLANEOUS) ×1 IMPLANT
KIT BASIN OR (CUSTOM PROCEDURE TRAY) ×4 IMPLANT
KIT TURNOVER KIT A (KITS) ×4 IMPLANT
NEEDLE INSUFFLATION 14GA 120MM (NEEDLE) ×2 IMPLANT
PENCIL SMOKE EVACUATOR (MISCELLANEOUS) IMPLANT
POUCH SPECIMEN RETRIEVAL 10MM (ENDOMECHANICALS) ×4 IMPLANT
RELOAD 45 VASCULAR/THIN (ENDOMECHANICALS) IMPLANT
RELOAD STAPLE TA45 3.5 REG BLU (ENDOMECHANICALS) IMPLANT
SCISSORS LAP 5X35 DISP (ENDOMECHANICALS) ×2 IMPLANT
SET IRRIG TUBING LAPAROSCOPIC (IRRIGATION / IRRIGATOR) ×2 IMPLANT
SET TUBE SMOKE EVAC HIGH FLOW (TUBING) ×4 IMPLANT
SHEARS HARMONIC ACE PLUS 36CM (ENDOMECHANICALS) ×2 IMPLANT
SLEEVE XCEL OPT CAN 5 100 (ENDOMECHANICALS) ×2 IMPLANT
STRIP CLOSURE SKIN 1/2X4 (GAUZE/BANDAGES/DRESSINGS) ×2 IMPLANT
SUT ETHILON 2 0 PS N (SUTURE) IMPLANT
SUT MNCRL AB 4-0 PS2 18 (SUTURE) ×4 IMPLANT
TOWEL OR 17X26 10 PK STRL BLUE (TOWEL DISPOSABLE) ×4 IMPLANT
TOWEL OR NON WOVEN STRL DISP B (DISPOSABLE) ×4 IMPLANT
TRAY FOLEY MTR SLVR 14FR STAT (SET/KITS/TRAYS/PACK) IMPLANT
TRAY FOLEY MTR SLVR 16FR STAT (SET/KITS/TRAYS/PACK) IMPLANT
TRAY LAPAROSCOPIC (CUSTOM PROCEDURE TRAY) ×4 IMPLANT
TROCAR BLADELESS OPT 5 100 (ENDOMECHANICALS) ×2 IMPLANT
TROCAR XCEL 12X100 BLDLESS (ENDOMECHANICALS) ×2 IMPLANT
TROCAR XCEL BLUNT TIP 100MML (ENDOMECHANICALS) ×2 IMPLANT
TROCAR XCEL NON-BLD 11X100MML (ENDOMECHANICALS) ×2 IMPLANT

## 2020-12-04 NOTE — Op Note (Signed)
Operative Report  Janet Love 30 y.o. female  [id number]  [id number]  12/04/2020  Surgeon: Phylliss Blakes MD FACS  Procedure performed: Laparoscopic Appendectomy  Preop diagnosis: Acute appendicitis  Post-op diagnosis/intraop findings: Acute appendicitis with purulent peritonitis  Specimens: appendix  EBL: minimal  Complications: none  Description of procedure: After obtaining informed consent the patient was brought to the operating room. Antibiotics had been administered. SCD's were applied. General endotracheal anesthesia was initiated and a formal time-out was performed. Foley inserted which was removed at the end of the case. The abdomen was prepped and draped in the usual sterile fashion and the abdomen was entered using an infraumbilical Veress needle and insufflated to 15 mmHg. A 5 mm trocar and camera were then introduced, the abdomen was inspected and there is no evidence of injury from our entry. A suprapubic 5 mm trocar and a left lower quadrant 12 mm trocar were introduced under direct visualization following infiltration with local. The patient was then placed in Trendelenburg and rotated to the left and the small bowel was reflected cephalad.   The appendix was visualized and noted to be partially retrocecal, with inflammation and a gangrenous segment just distal to the base. There was purulent fluid/pus in the right colic gutter and in the pelvis which was aspirated. A combination of blunt dissection and harmonic scalpel were used to free it of its retroperitoneal attachments. Great care was taken to ensure no injury to surrounding retroperitoneal structures, cecum or terminal ileum. A window was created at the base of the appendix and a white load linear cutting stapler was used to transect the appendix from the cecum. The harmonic scalpel was then used to transect the appendiceal mesentery. Hemostasis was ensured. The appendix was placed in an Endo Catch bag and  removed through our 12 mm trocar site. The right lower quadrant and pelvis were irrigated and aspirated; the effluent was now clear. Hemostasis was excellent. The 35mm trocar site in the left lower quadrant was closed with a 0 vicryl in the fascia under direct visualization using a PMI device. The abdomen was desufflated and all trocars removed. The skin incisions were closed with subcuticular 4-0 monocryl and Dermabond. The patient was awakened, extubated and transported to the recovery room in stable condition.   All counts were correct at the completion of the case.

## 2020-12-04 NOTE — Transfer of Care (Signed)
Immediate Anesthesia Transfer of Care Note  Patient: GLENETTA KIGER  Procedure(s) Performed: APPENDECTOMY LAPAROSCOPIC (N/A Abdomen)  Patient Location: PACU  Anesthesia Type:General  Level of Consciousness: sedated  Airway & Oxygen Therapy: Patient Spontanous Breathing and Patient connected to face mask oxygen  Post-op Assessment: Report given to RN and Post -op Vital signs reviewed and stable  Post vital signs: Reviewed and stable  Last Vitals:  Vitals Value Taken Time  BP    Temp    Pulse    Resp    SpO2      Last Pain:  Vitals:   12/04/20 1122  TempSrc: Oral  PainSc: 7       Patients Stated Pain Goal: 5 (12/04/20 1122)  Complications: No complications documented.

## 2020-12-04 NOTE — Anesthesia Procedure Notes (Signed)
Procedure Name: Intubation Date/Time: 12/04/2020 12:53 PM Performed by: Talbot Grumbling, CRNA Pre-anesthesia Checklist: Patient identified, Emergency Drugs available, Suction available and Patient being monitored Patient Re-evaluated:Patient Re-evaluated prior to induction Oxygen Delivery Method: Circle system utilized Preoxygenation: Pre-oxygenation with 100% oxygen Induction Type: IV induction and Cricoid Pressure applied Laryngoscope Size: Mac and 3 Grade View: Grade I Tube type: Oral Tube size: 7.0 mm Number of attempts: 1 Airway Equipment and Method: Stylet Placement Confirmation: ETT inserted through vocal cords under direct vision,  positive ETCO2 and breath sounds checked- equal and bilateral Secured at: 21 cm Tube secured with: Tape Dental Injury: Teeth and Oropharynx as per pre-operative assessment  Comments: Intubated by Daiva Nakayama, paramedic student

## 2020-12-04 NOTE — Interval H&P Note (Signed)
History and Physical Interval Note:  12/04/2020 11:18 AM  Janet Love  has presented today for surgery, with the diagnosis of ACUTE APPENDICITIS.  The various methods of treatment have been discussed with the patient and family. After consideration of risks, benefits and other options for treatment, the patient has consented to  Procedure(s): APPENDECTOMY LAPAROSCOPIC (N/A) as a surgical intervention.  The patient's history has been reviewed, patient examined, no change in status, stable for surgery.  I have reviewed the patient's chart and labs.  Questions were answered to the patient's satisfaction.     Fard Borunda Lollie Sails

## 2020-12-05 ENCOUNTER — Encounter (HOSPITAL_COMMUNITY): Payer: Self-pay | Admitting: Surgery

## 2020-12-05 LAB — SURGICAL PATHOLOGY

## 2020-12-05 MED ORDER — ACETAMINOPHEN 325 MG PO TABS: 650.0000 mg | ORAL_TABLET | Freq: Four times a day (QID) | ORAL | Status: DC | PRN

## 2020-12-05 MED ORDER — IBUPROFEN 200 MG PO TABS: 400.0000 mg | ORAL_TABLET | Freq: Four times a day (QID) | ORAL | Status: DC | PRN

## 2020-12-05 MED ORDER — TRAMADOL HCL 50 MG PO TABS: 50.0000 mg | ORAL_TABLET | Freq: Four times a day (QID) | ORAL | 0 refills | Status: DC | PRN

## 2020-12-05 NOTE — Plan of Care (Signed)

## 2020-12-05 NOTE — Progress Notes (Signed)
Patient was given DC instructions and all questions were answered. Patient taken to main entrance via wheelchair by NT.

## 2020-12-05 NOTE — Anesthesia Postprocedure Evaluation (Signed)
Anesthesia Post Note  Patient: HILIARY OSORTO  Procedure(s) Performed: APPENDECTOMY LAPAROSCOPIC (N/A Abdomen)     Patient location during evaluation: PACU Anesthesia Type: General Level of consciousness: awake and alert Pain management: pain level controlled Vital Signs Assessment: post-procedure vital signs reviewed and stable Respiratory status: spontaneous breathing, nonlabored ventilation, respiratory function stable and patient connected to nasal cannula oxygen Cardiovascular status: blood pressure returned to baseline and stable Postop Assessment: no apparent nausea or vomiting Anesthetic complications: no   No complications documented.  Last Vitals:  Vitals:   12/05/20 0603 12/05/20 0959  BP: 110/66 118/75  Pulse: 62 78  Resp: 18   Temp: 36.8 C 36.9 C  SpO2: 98% 100%    Last Pain:  Vitals:   12/05/20 0959  TempSrc: Oral  PainSc:                  Zayonna Ayuso

## 2020-12-05 NOTE — Discharge Summary (Signed)
[facility] Discharge Summary   Patient ID: Janet Love MRN: [id number] DOB/AGE: 01/07/1991 29 y.o.  Admit date: 12/03/2020 Discharge date: 12/05/2020  Admitting Diagnosis: Acute appendicitis  Discharge Diagnosis Patient Active Problem List   Diagnosis Date Noted  . Appendicitis, acute 12/03/2020  . Acute appendicitis 12/03/2020  . At risk for fertility problems 09/17/2020  . Upper respiratory tract infection 09/17/2020  . Sore throat 09/17/2020  . IUD (intrauterine device) in place 11/13/2014    Consultants None  Imaging: CT Abdomen Pelvis W Contrast  Result Date: 12/03/2020 CLINICAL DATA:  Right lower quadrant abdominal pain. EXAM: CT ABDOMEN AND PELVIS WITH CONTRAST TECHNIQUE: Multidetector CT imaging of the abdomen and pelvis was performed using the standard protocol following bolus administration of intravenous contrast. CONTRAST:  OMNIPAQUE IOHEXOL 300 MG/ML  SOLN COMPARISON:  November 24, 2011. FINDINGS: Lower chest: No acute abnormality. Hepatobiliary: No focal liver abnormality is seen. No gallstones, gallbladder wall thickening, or biliary dilatation. Pancreas: Unremarkable. No pancreatic ductal dilatation or surrounding inflammatory changes. Spleen: Normal in size without focal abnormality. Adrenals/Urinary Tract: Adrenal glands are unremarkable. Kidneys are normal, without renal calculi, focal lesion, or hydronephrosis. Bladder is unremarkable. Stomach/Bowel: The stomach appears normal. There is no evidence of bowel obstruction. The appendix is enlarged with surrounding inflammation consistent with acute appendicitis. Appendix: Location: Right lower quadrant. Diameter: 13 mm. Appendicolith: Yes. Mucosal hyper-enhancement: Yes. Extraluminal gas: No. Periappendiceal collection: No. Vascular/Lymphatic: No significant vascular findings are present. No enlarged abdominal or pelvic lymph nodes. Reproductive: Intrauterine device is noted. No adnexal abnormality is  noted. Other: No abdominal wall hernia or abnormality. No abdominopelvic ascites. Musculoskeletal: No acute or significant osseous findings. IMPRESSION: Findings consistent with acute appendicitis. No abscess is noted. Electronically Signed   By: Lupita Raider M.D.   On: 12/03/2020 19:13    Procedures Dr. Fredricka Bonine (12/04/2020) - Laparoscopic Appendectomy  Hospital Course:  Janet Love  is a 30yo female who presented to [facility] 3/28 with acute onset RLQ abdominal pain.  Workup showed acute appendicitis.  Patient was admitted and underwent procedure listed above.  Tolerated procedure well and was transferred to the floor.  Diet was advanced as tolerated.  On POD1, the patient was voiding well, tolerating diet, ambulating well, pain well controlled, vital signs stable, incisions c/d/i and felt stable for discharge home.  Patient will follow up as below and knows to call with questions or concerns.    I have personally reviewed the patients medication history on the [place] controlled substance database.    Physical Exam: General:  Alert, NAD, pleasant, comfortable Pulm: rate and effort normal Abd:  Soft, ND, appropriately tender, multiple lap incisions C/D/I  Allergies as of 12/05/2020   No Known Allergies     Medication List    STOP taking these medications   amoxicillin 500 MG capsule Commonly known as: AMOXIL   guaiFENesin 600 MG 12 hr tablet Commonly known as: Mucinex   ondansetron 4 MG disintegrating tablet Commonly known as: Zofran ODT     TAKE these medications   acetaminophen 325 MG tablet Commonly known as: TYLENOL Take 2 tablets (650 mg total) by mouth every 6 (six) hours as needed for mild pain (or temp > 100).   ibuprofen 200 MG tablet Commonly known as: ADVIL Take 2 tablets (400 mg total) by mouth every 6 (six) hours as needed for moderate pain.   Mirena (52 MG) 20 MCG/24HR IUD Generic drug: levonorgestrel 1 each by Intrauterine route once. 2018  traMADol 50 MG  tablet Commonly known as: ULTRAM Take 1 tablet (50 mg total) by mouth every 6 (six) hours as needed for severe pain.   venlafaxine XR 75 MG 24 hr capsule Commonly known as: EFFEXOR-XR TAKE 1 CAPSULE(75 MG) BY MOUTH DAILY WITH BREAKFAST. STOP [facility]         Follow-up Information    [facility], [place]. Call.   Specialty: General Surgery Why: We are working on your appointment, call to confirm Please arrive 30 minutes prior to your appointment to check in and fill out paperwork. Bring photo ID and insurance information. Contact information: [address] [place] [address] [phone]              Signed: Franne Forts, [facility] 12/05/2020, 9:23 AM Please see Amion for pager number during day hours 7:00am-4:30pm

## 2020-12-05 NOTE — Discharge Instructions (Signed)
[facility] ° °Please arrive at least 30 min before your appointment to complete your check in paperwork.  If you are unable to arrive 30 min prior to your appointment time we may have to cancel or reschedule you. °LAPAROSCOPIC SURGERY: POST OP INSTRUCTIONS °Always review your discharge instruction sheet given to you by the facility where your surgery was performed. °IF YOU HAVE DISABILITY OR FAMILY LEAVE FORMS, YOU MUST BRING THEM TO THE OFFICE FOR PROCESSING.   °DO NOT GIVE THEM TO YOUR DOCTOR. ° °PAIN CONTROL ° °1. First take acetaminophen (Tylenol) AND/or ibuprofen (Advil) to control your pain after surgery.  Follow directions on package.  Taking acetaminophen (Tylenol) and/or ibuprofen (Advil) regularly after surgery will help to control your pain and lower the amount of prescription pain medication you may need.  You should not take more than 4,000 mg (4 grams) of acetaminophen (Tylenol) in 24 hours.  You should not take ibuprofen (Advil), aleve, motrin, naprosyn or other NSAIDS if you have a history of stomach ulcers or chronic kidney disease.  °2. A prescription for pain medication may be given to you upon discharge.  Take your pain medication as prescribed, if you still have uncontrolled pain after taking acetaminophen (Tylenol) or ibuprofen (Advil). °3. Use ice packs to help control pain. °4. If you need a refill on your pain medication, please contact your pharmacy.  They will contact our office to request authorization. Prescriptions will not be filled after 5pm or on week-ends. ° °HOME MEDICATIONS °5. Take your usually prescribed medications unless otherwise directed. ° °DIET °6. You should follow a light diet the first few days after arrival home.  Be sure to include lots of fluids daily. Avoid fatty, fried foods.  ° °CONSTIPATION °7. It is common to experience some constipation after surgery and if you are taking pain medication.  Increasing fluid intake and taking a stool  softener (such as Colace) will usually help or prevent this problem from occurring.  A mild laxative (Milk of Magnesia or Miralax) should be taken according to package instructions if there are no bowel movements after 48 hours. ° °WOUND/INCISION CARE °8. Most patients will experience some swelling and bruising in the area of the incisions.  Ice packs will help.  Swelling and bruising can take several days to resolve.  °9. Unless discharge instructions indicate otherwise, follow guidelines below  °a. STERI-STRIPS - you may remove your outer bandages 48 hours after surgery, and you may shower at that time.  You have steri-strips (small skin tapes) in place directly over the incision.  These strips should be left on the skin for 7-10 days.   °b. DERMABOND/SKIN GLUE - you may shower in 24 hours.  The glue will flake off over the next 2-3 weeks. °10. Any sutures or staples will be removed at the office during your follow-up visit. ° °ACTIVITIES °11. You may resume regular (light) daily activities beginning the next day--such as daily self-care, walking, climbing stairs--gradually increasing activities as tolerated.  You may have sexual intercourse when it is comfortable.  Refrain from any heavy lifting or straining until approved by your doctor. °a. You may drive when you are no longer taking prescription pain medication, you can comfortably wear a seatbelt, and you can safely maneuver your car and apply brakes. ° °FOLLOW-UP °12. You should see your doctor in the office for a follow-up appointment approximately 2-3 weeks after your surgery.  You should have been given your post-op/follow-up appointment when   your surgery was scheduled.  If you did not receive a post-op/follow-up appointment, make sure that you call for this appointment within a day or two after you arrive home to insure a convenient appointment time. ° °OTHER INSTRUCTIONS ° °WHEN TO CALL YOUR DOCTOR: °1. Fever over 101.0 °2. Inability to  urinate °3. Continued bleeding from incision. °4. Increased pain, redness, or drainage from the incision. °5. Increasing abdominal pain ° °The clinic staff is available to answer your questions during regular business hours.  Please don’t hesitate to call and ask to speak to one of the nurses for clinical concerns.  If you have a medical emergency, go to the nearest emergency room or call 911.  A surgeon from [facility] is always on call at the hospital. °[address], [address], [place], [place]  [address] ? [address] [address], [place], [place]   [address] °[phone] ? [phone] ? FAX [phone] ° ° ° °

## 2020-12-07 ENCOUNTER — Telehealth: Payer: Self-pay

## 2020-12-07 NOTE — Telephone Encounter (Signed)
Patient called to schedule follow up with provider; pain on left side after appendectomy. Scheduled with NP Bradly Bienenstock for 12/07/20.  Patient also stated she'd like to establish [facility] as her primary; she's aware he's not accepting new patients at this time. Patient stated she had a long conversation with him about taking over her care. Please advise at [phone].

## 2020-12-10 ENCOUNTER — Other Ambulatory Visit: Payer: Self-pay

## 2020-12-10 ENCOUNTER — Encounter: Payer: Self-pay | Admitting: Nurse Practitioner

## 2020-12-10 ENCOUNTER — Ambulatory Visit (INDEPENDENT_AMBULATORY_CARE_PROVIDER_SITE_OTHER): Payer: Medicaid Other | Admitting: Nurse Practitioner

## 2020-12-10 DIAGNOSIS — Z9049 Acquired absence of other specified parts of digestive tract: Secondary | ICD-10-CM

## 2020-12-10 NOTE — Telephone Encounter (Signed)
Please advise 

## 2020-12-10 NOTE — Assessment & Plan Note (Addendum)
Surgery 12/04/20.  With LLQ pain right around incision, worse with laying flat and walking.  No red flags in history/on examination - no fevers, nausea/vomiting, constipation.  Likely related to either post surgical constipation vs. Surgical incision/instrumentation during surgery.  Encouraged continued use of Tylenol/ice.  Also encouraged stool softeners in case of hard stool in colon related to medication during surgery/postopeartive use of Tramadol.  If pain does not improve, encouraged patient to reach out to general surgery office for instructions/follow up.

## 2020-12-10 NOTE — Telephone Encounter (Signed)
This is up to [place].  We do not co-manage patients and I am not taking new patients at this time.

## 2020-12-10 NOTE — Progress Notes (Signed)
Subjective:    Patient ID: Janet Love, female    DOB: [birth year], 30 y.o.   MRN: [id number]  HPI: Janet Love is a 30 y.o. female presenting for left sided abdominal pain.  Chief Complaint  Patient presents with  . Hospitalization Follow-up    Appendix removed  last month. Having pain on the left side. Has not started bk taking anxiety  med since she has been home from surgery. Say that she does have burning sensation at surgical site. She has also seen increase in body temp   ABDOMINAL PAIN  Had laparoscopic appendectomy 12/04/2020.  Reports since surgery, she has had sharp/burning pain in her left lower quadrant. Duration: days Onset: constant, eases with sitting. Worse when laying down or standing up walking Severity: sitting down - moderate; laying down - severe; worse with walking Quality: sharp, burnig Location:  LLQ  Episode duration: constant Radiation: no Frequency: constant Alleviating factors: sitting Aggravating factors: laying down, walking, certain movements Status: stable Treatments attempted: Tramadol, Tylenol, motrin Fever: no Nausea: no Vomiting: no Weight loss: no Decreased appetite: gets fuller faster- eats less Diarrhea: yes; since surgery 2-3 x per day, soft bowel movements dark brown Constipation: no Blood in stool: no Heartburn: not new Jaundice: no Rash: no Dysuria/urinary frequency: no Hematuria: no Tylenol/Advil mixed: every 4-6 hours  No Known Allergies  Outpatient Encounter Medications as of 12/10/2020  Medication Sig  . acetaminophen (TYLENOL) 325 MG tablet Take 2 tablets (650 mg total) by mouth every 6 (six) hours as needed for mild pain (or temp > 100).  Marland Kitchen ibuprofen (ADVIL) 200 MG tablet Take 2 tablets (400 mg total) by mouth every 6 (six) hours as needed for moderate pain.  Marland Kitchen levonorgestrel (MIRENA, 52 MG,) 20 MCG/24HR IUD 1 each by Intrauterine route once. 2018  . traMADol (ULTRAM) 50 MG tablet Take 1 tablet (50 mg total) by  mouth every 6 (six) hours as needed for severe pain.  Marland Kitchen venlafaxine XR (EFFEXOR-XR) 75 MG 24 hr capsule TAKE 1 CAPSULE(75 MG) BY MOUTH DAILY WITH BREAKFAST. STOP LEXAPRO (Patient not taking: Reported on 12/10/2020)   No facility-administered encounter medications on file as of 12/10/2020.    Patient Active Problem List   Diagnosis Date Noted  . S/P laparoscopic appendectomy 12/10/2020  . At risk for fertility problems 09/17/2020  . IUD (intrauterine device) in place 11/13/2014    Past Medical History:  Diagnosis Date  . Acute appendicitis 12/03/2020  . Anxiety    Phreesia 04/26/2020  . Appendicitis, acute 12/03/2020  . Hx of varicella   . Sore throat 09/17/2020  . Upper respiratory tract infection 09/17/2020    Relevant past medical, surgical, family and social history reviewed and updated as indicated. Interim medical history since our last visit reviewed.  Review of Systems Per HPI unless specifically indicated above     Objective:    BP 122/74   Pulse 85   Temp 98.2 F (36.8 C)   Ht 5\' 2"  (1.575 m)   Wt 190 lb (86.2 kg)   LMP 12/03/2020   SpO2 97%   BMI 34.75 kg/m   Wt Readings from Last 3 Encounters:  12/10/20 190 lb (86.2 kg)  12/03/20 196 lb (88.9 kg)  04/26/20 183 lb 3.2 oz (83.1 kg)    Physical Exam Vitals and nursing note reviewed.  Constitutional:      General: She is not in acute distress.    Appearance: Normal appearance. She is obese. She is not  toxic-appearing.  Abdominal:     General: Abdomen is flat. Bowel sounds are normal. There is no distension.     Palpations: Abdomen is soft. There is no mass.     Tenderness: There is abdominal tenderness in the left lower quadrant. There is no right CVA tenderness or left CVA tenderness.     Hernia: No hernia is present.    Skin:    General: Skin is warm and dry.     Coloration: Skin is not jaundiced or pale.     Findings: No erythema.  Neurological:     General: No focal deficit present.     Mental  Status: She is alert and oriented to person, place, and time.  Psychiatric:        Mood and Affect: Mood normal.        Behavior: Behavior normal.        Thought Content: Thought content normal.        Judgment: Judgment normal.        Assessment & Plan:   Problem List Items Addressed This Visit      Other   S/P laparoscopic appendectomy    Surgery 12/04/20.  With LLQ pain right around incision, worse with laying flat and walking.  No red flags in history/on examination.  Likely related to either post surgical constipation vs. Surgical incision/instrumentation during surgery.  Encouraged continued use of Tylenol/ice.  Also encouraged stool softeners in case of hard stool in colon related to medication during surgery/postopeartive use of Tramadol.  If pain does not improve, encouraged patient to reach out to general surgery office for instructions/follow up.          Follow up plan: Return if symptoms worsen or fail to improve.

## 2021-07-15 ENCOUNTER — Other Ambulatory Visit: Payer: Self-pay | Admitting: Family Medicine

## 2021-07-26 ENCOUNTER — Other Ambulatory Visit: Payer: Self-pay | Admitting: *Deleted

## 2021-07-26 MED ORDER — VENLAFAXINE HCL ER 75 MG PO CP24: ORAL_CAPSULE | ORAL | 2 refills | Status: DC

## 2021-08-09 ENCOUNTER — Ambulatory Visit: Payer: Medicaid Other | Admitting: Family Medicine

## 2021-08-12 ENCOUNTER — Ambulatory Visit: Payer: Medicaid Other | Admitting: Family Medicine

## 2021-11-22 ENCOUNTER — Other Ambulatory Visit: Payer: Self-pay | Admitting: Family Medicine

## 2022-01-17 ENCOUNTER — Ambulatory Visit: Payer: Medicaid Other | Admitting: Family Medicine

## 2022-01-17 VITALS — BP 110/84 | HR 87 | Temp 97.4°F | Ht 62.0 in | Wt 192.0 lb

## 2022-01-17 DIAGNOSIS — R7989 Other specified abnormal findings of blood chemistry: Secondary | ICD-10-CM

## 2022-01-17 NOTE — Progress Notes (Signed)
? ?Subjective:  ? ? Patient ID: Janet Love, female    DOB: [birth year], 31 y.o.   MRN: [id number] ? ?HPI  ?Patient is a very sweet 31 year old Caucasian female here today for a checkup.  She has a history of generalized anxiety disorder for which she takes venlafaxine.  She states that the medication really helps.  She still deals with stress and anxiety so she feels like she benefits from the medication although she denies having any panic attacks like she was in 2021.  She denies any more near syncopal episodes or syncopal episodes or chest pain or shortness of breath.  Of note on her recent lab work, she has had elevated liver function test.  Right upper quadrant ultrasound was unremarkable.  I suspect that she has mild underlying fatty liver disease.  We discussed this at length.  She does not drink excessive alcohol.  She has no family history of liver disorders. ?Past Medical History:  ?Diagnosis Date  ? Acute appendicitis 12/03/2020  ? Anxiety   ? Phreesia 04/26/2020  ? Appendicitis, acute 12/03/2020  ? Hx of varicella   ? Sore throat 09/17/2020  ? Upper respiratory tract infection 09/17/2020  ? ?Past Surgical History:  ?Procedure Laterality Date  ? LAPAROSCOPIC APPENDECTOMY N/A 12/04/2020  ? Procedure: APPENDECTOMY LAPAROSCOPIC;  Surgeon: Berna Bue, MD;  Location: [facility];  Service: General;  Laterality: N/A;  ? NO PAST SURGERIES    ? ?Current Outpatient Medications on File Prior to Visit  ?Medication Sig Dispense Refill  ? acetaminophen (TYLENOL) 325 MG tablet Take 2 tablets (650 mg total) by mouth every 6 (six) hours as needed for mild pain (or temp > 100).    ? ibuprofen (ADVIL) 200 MG tablet Take 2 tablets (400 mg total) by mouth every 6 (six) hours as needed for moderate pain.    ? levonorgestrel (MIRENA, 52 MG,) 20 MCG/24HR IUD 1 each by Intrauterine route once. 2018    ? traMADol (ULTRAM) 50 MG tablet Take 1 tablet (50 mg total) by mouth every 6 (six) hours as needed for severe pain. 15 tablet 0   ? venlafaxine XR (EFFEXOR-XR) 75 MG 24 hr capsule TAKE 1 CAPSULE(75 MG) BY MOUTH DAILY WITH BREAKFAST. 30 capsule 2  ? ?No current facility-administered medications on file prior to visit.  ? ?No Known Allergies ?Social History  ? ?Socioeconomic History  ? Marital status: Married  ?  Spouse name: Not on file  ? Number of children: Not on file  ? Years of education: Not on file  ? Highest education level: Not on file  ?Occupational History  ? Not on file  ?Tobacco Use  ? Smoking status: Former  ?  Types: Cigarettes  ?  Quit date: 09/28/2008  ?  Years since quitting: 13.3  ? Smokeless tobacco: Never  ?Vaping Use  ? Vaping Use: Never used  ?Substance and Sexual Activity  ? Alcohol use: No  ? Drug use: No  ? Sexual activity: Yes  ?  Birth control/protection: I.U.D.  ?Other Topics Concern  ? Not on file  ?Social History Narrative  ? Not on file  ? ?Social Determinants of Health  ? ?Financial Resource Strain: Not on file  ?Food Insecurity: Not on file  ?Transportation Needs: Not on file  ?Physical Activity: Not on file  ?Stress: Not on file  ?Social Connections: Not on file  ?Intimate Partner Violence: Not on file  ? ? ? ? ?Review of Systems ? ?   ?Objective:  ?  Physical Exam ?Vitals reviewed.  ?Constitutional:   ?   General: She is not in acute distress. ?   Appearance: Normal appearance. She is not ill-appearing or toxic-appearing.  ?Cardiovascular:  ?   Rate and Rhythm: Normal rate and regular rhythm.  ?   Heart sounds: Normal heart sounds.  ?Pulmonary:  ?   Effort: Pulmonary effort is normal. No respiratory distress.  ?   Breath sounds: Normal breath sounds. No stridor. No wheezing, rhonchi or rales.  ?Neurological:  ?   General: No focal deficit present.  ?   Mental Status: She is alert and oriented to person, place, and time. Mental status is at baseline.  ?   Cranial Nerves: No cranial nerve deficit.  ?   Sensory: No sensory deficit.  ?   Motor: No weakness.  ?   Coordination: Coordination normal.  ? ? ?    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  I recommended 30 minutes a day 5 days a week of aerobic exercise.  I recommended a low carbohydrate low-fat diet rich in fruits and vegetables.  I would like her to try to lose even 10 pounds which would be beneficial.  I will recheck her liver function test today to monitor for any progression.  We discussed weaning off the venlafaxine but at the present time she would like to continue the medication which is fine.  She is not yet due for a mammogram or colonoscopy.  I recommended a Pap smear at her earliest convenience. ?

## 2022-01-18 LAB — COMPLETE METABOLIC PANEL WITH GFR
AG Ratio: 1.7 (calc) (ref 1.0–2.5)
ALT: 38 U/L — ABNORMAL HIGH (ref 6–29)
AST: 18 U/L (ref 10–30)
Albumin: 4.2 g/dL (ref 3.6–5.1)
Alkaline phosphatase (APISO): 77 U/L (ref 31–125)
BUN: 11 mg/dL (ref 7–25)
CO2: 25 mmol/L (ref 20–32)
Calcium: 9.2 mg/dL (ref 8.6–10.2)
Chloride: 107 mmol/L (ref 98–110)
Creat: 0.86 mg/dL (ref 0.50–0.97)
Globulin: 2.5 g/dL (calc) (ref 1.9–3.7)
Glucose, Bld: 86 mg/dL (ref 65–99)
Potassium: 4.4 mmol/L (ref 3.5–5.3)
Sodium: 139 mmol/L (ref 135–146)
Total Bilirubin: 0.5 mg/dL (ref 0.2–1.2)
Total Protein: 6.7 g/dL (ref 6.1–8.1)
eGFR: 93 mL/min/{1.73_m2} (ref >=60)

## 2022-01-18 LAB — CBC WITH DIFFERENTIAL/PLATELET
Absolute Monocytes: 485 cells/uL (ref 200–950)
Basophils Absolute: 40 cells/uL (ref 0–200)
Basophils Relative: 0.4 %
Eosinophils Absolute: 188 cells/uL (ref 15–500)
Eosinophils Relative: 1.9 %
HCT: 38.3 % (ref 35.0–45.0)
Hemoglobin: 12.8 g/dL (ref 11.7–15.5)
Lymphs Abs: 2039 cells/uL (ref 850–3900)
MCH: 31.4 pg (ref 27.0–33.0)
MCHC: 33.4 g/dL (ref 32.0–36.0)
MCV: 94.1 fL (ref 80.0–100.0)
MPV: 12.2 fL (ref 7.5–12.5)
Monocytes Relative: 4.9 %
Neutro Abs: 7148 cells/uL (ref 1500–7800)
Neutrophils Relative %: 72.2 %
Platelets: 185 10*3/uL (ref 140–400)
RBC: 4.07 10*6/uL (ref 3.80–5.10)
RDW: 12.5 % (ref 11.0–15.0)
Total Lymphocyte: 20.6 %
WBC: 9.9 10*3/uL (ref 3.8–10.8)

## 2022-02-12 ENCOUNTER — Other Ambulatory Visit: Payer: Self-pay | Admitting: Family Medicine

## 2022-02-12 NOTE — Telephone Encounter (Signed)
Requested Prescriptions  Pending Prescriptions Disp Refills  . venlafaxine XR (EFFEXOR-XR) 75 MG 24 hr capsule [Pharmacy Med Name: VENLAFAXINE ER 75MG  CAPSULES] 90 capsule 0    Sig: TAKE 1 CAPSULE(75 MG) BY MOUTH DAILY WITH BREAKFAST     Psychiatry: Antidepressants - SNRI - desvenlafaxine & venlafaxine Failed - 02/12/2022  2:59 PM      Failed - Lipid Panel in normal range within the last 12 months    No results found for: CHOL, POCCHOL, CHOLTOT No results found for: LDLCALC, LDLC, HIRISKLDL, POCLDL, LDLDIRECT, REALLDLC, TOTLDLC No results found for: HDL, POCHDL No results found for: TRIG, POCTRIG       Passed - Cr in normal range and within 360 days    Creat  Date Value Ref Range Status  01/17/2022 0.86 0.50 - 0.97 mg/dL Final         Passed - Last BP in normal range    BP Readings from Last 1 Encounters:  01/17/22 110/84         Passed - Valid encounter within last 6 months    Recent Outpatient Visits          3 weeks ago Elevated LFTs   [facility], MD   1 year ago S/P laparoscopic appendectomy   [facility], NP   1 year ago Upper respiratory tract infection, unspecified type   [facility], NP   1 year ago Gastroenteritis   [facility], FNP   2 years ago GAD (generalized anxiety disorder)   [facility], [facility], MD

## 2022-03-10 IMAGING — CT CT ABD-PELV W/ CM
2 of 4 series · 11 of 46 positions shown, 12 images · IV contrast (omnipaque)
Comparison: November 24, 2011.

CLINICAL DATA: Right lower quadrant abdominal pain.

EXAM:
CT ABDOMEN AND PELVIS WITH CONTRAST
TECHNIQUE: Multidetector CT imaging of the abdomen and pelvis was performed
using the standard protocol following bolus administration of
intravenous contrast.
CONTRAST:  100mL OMNIPAQUE IOHEXOL 300 MG/ML  SOLN

[Series 2: axial st · axial · 0.55mm/px · z∈[+1224,+1648]mm · 8 of 105 slices shown, 9 images]
[im 10/105  soft-tissue]
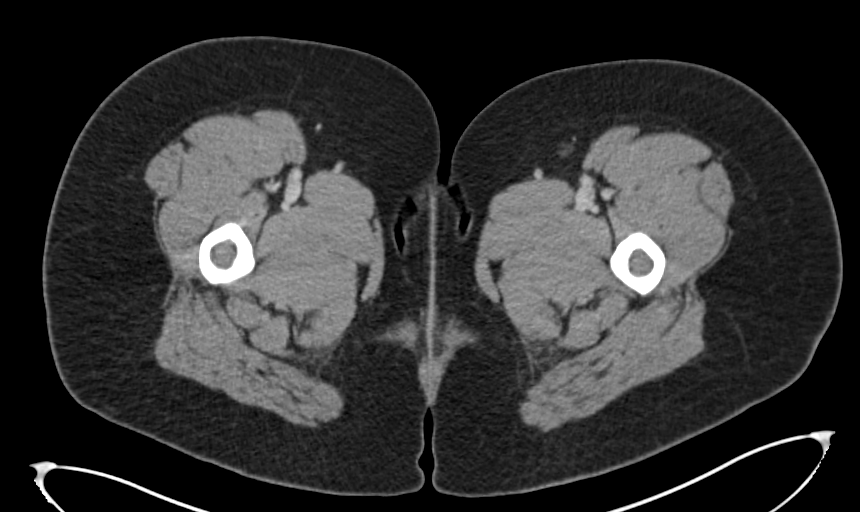
[im 10/105  bone]
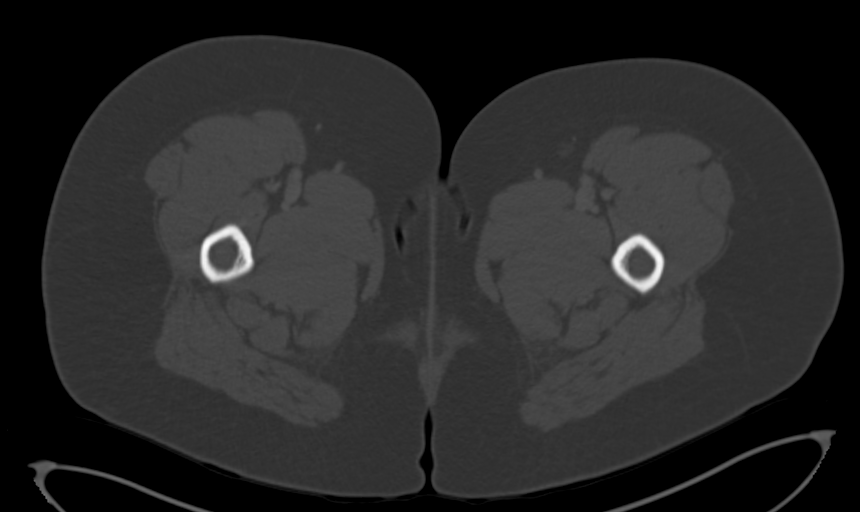
[im 20/105  soft-tissue]
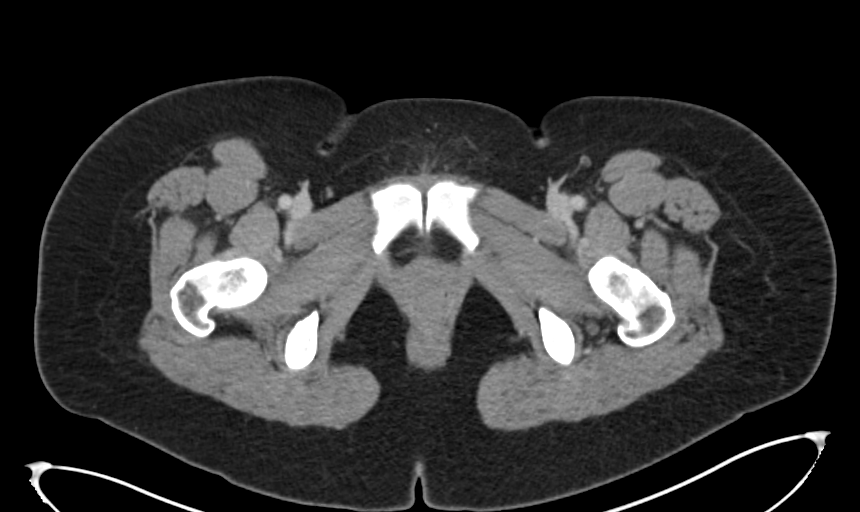
[im 35/105  soft-tissue]
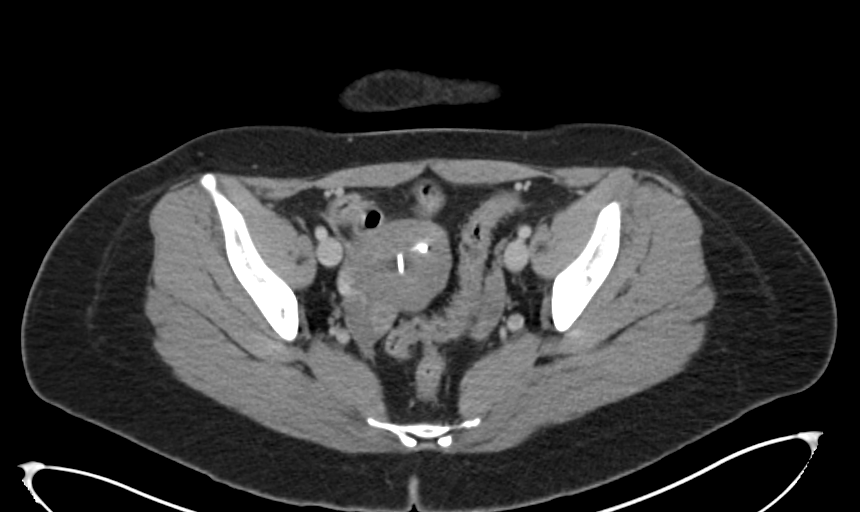
[im 45/105  soft-tissue]
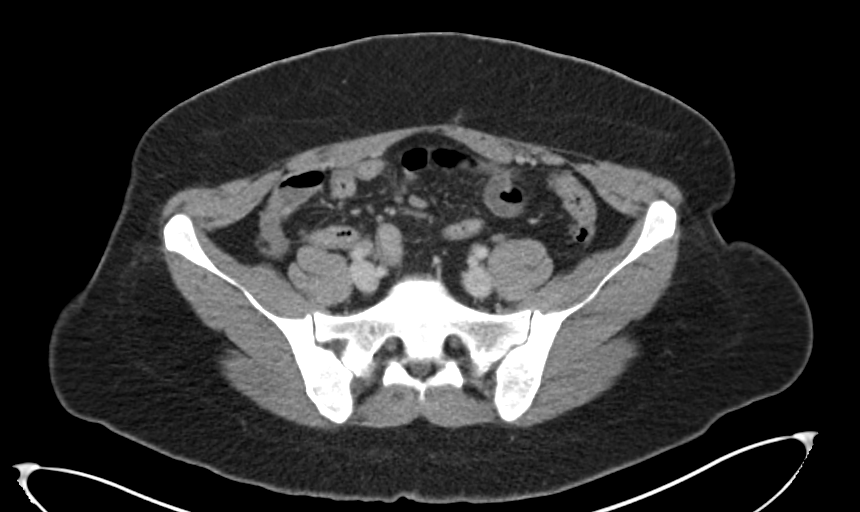
[im 60/105  soft-tissue]
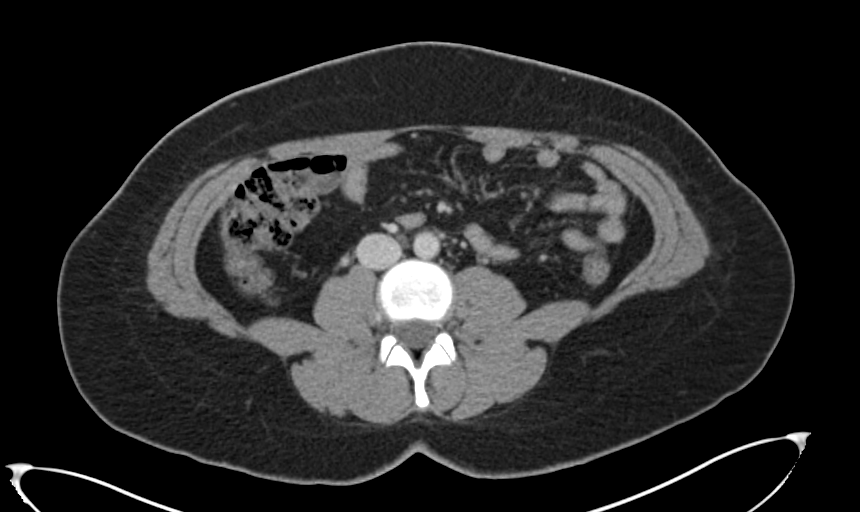
[im 70/105  soft-tissue]
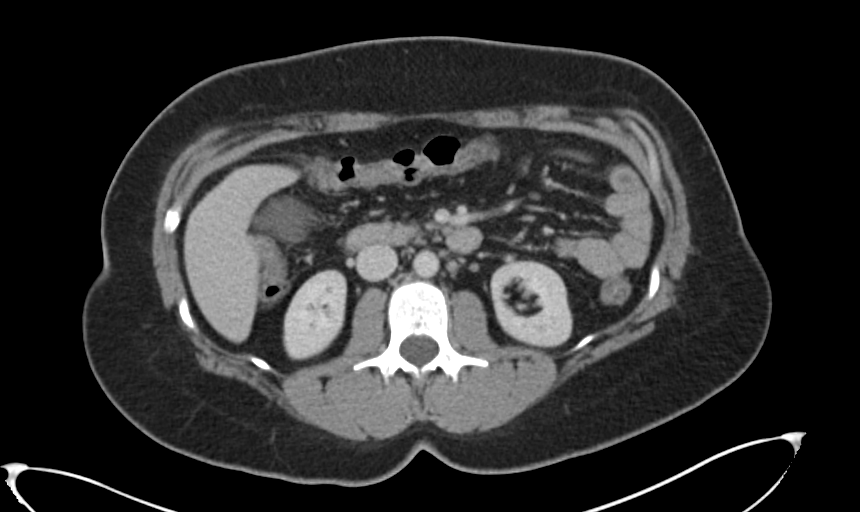
[im 85/105  soft-tissue]
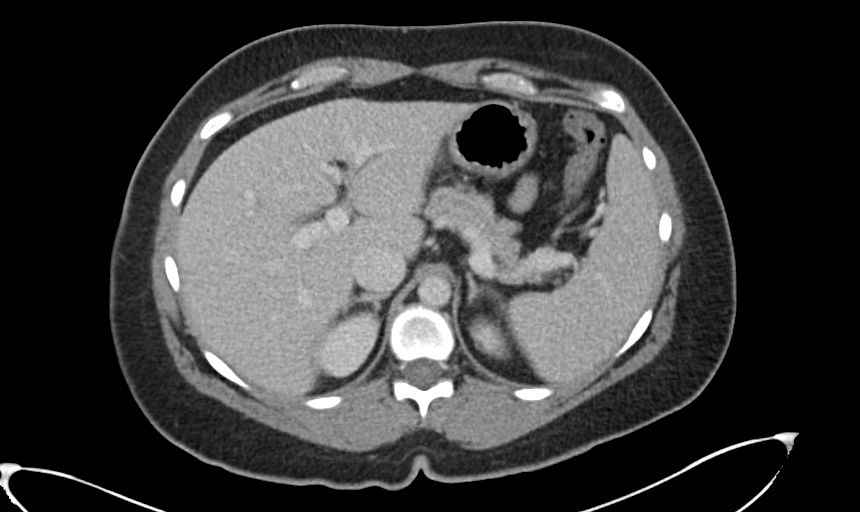
[im 95/105  soft-tissue]
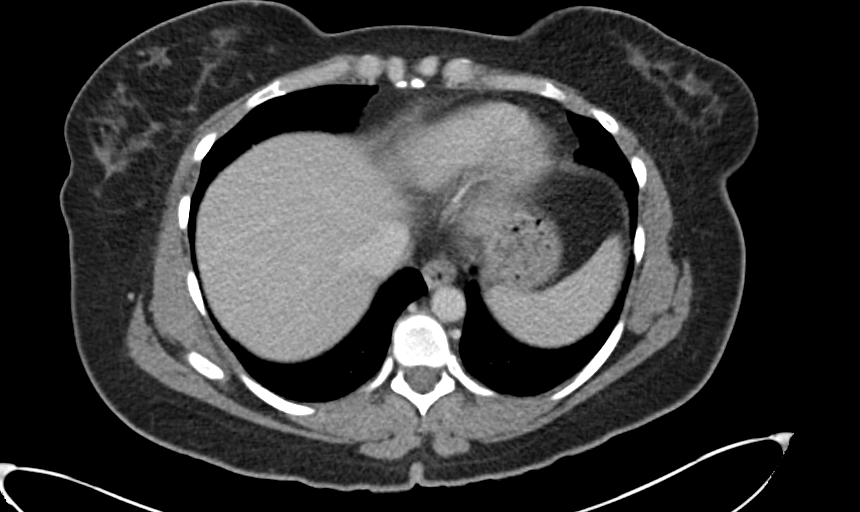

[Series 5: coronal st · coronal · 0.87mm/px · 3 of 99 slices shown]
[im 33/99  soft-tissue]
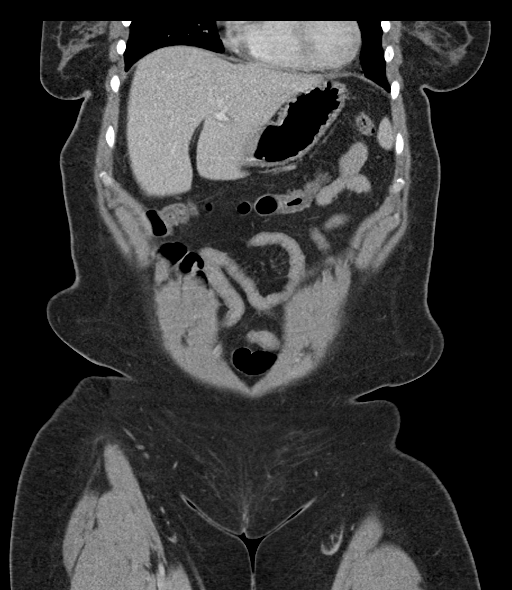
[im 44/99  soft-tissue]
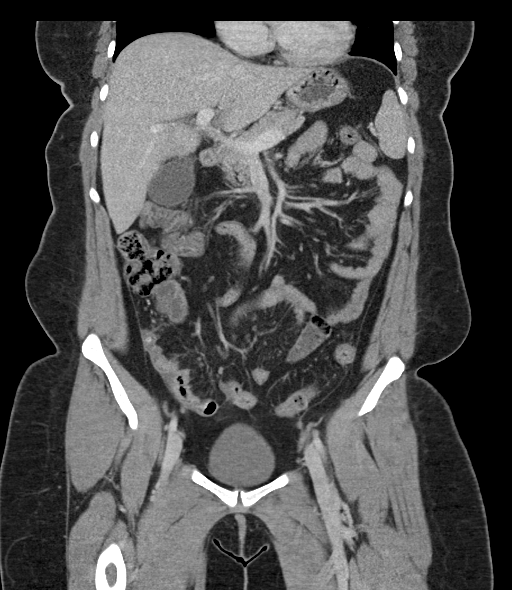
[im 55/99  soft-tissue]
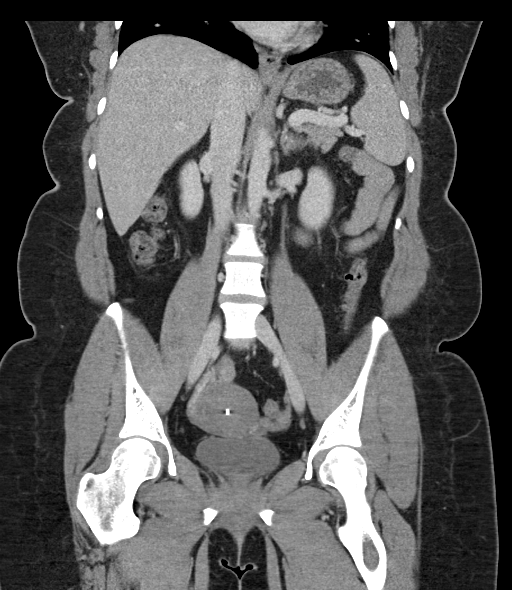

[11 of 46 positions shown; findings below may reference images not displayed]

FINDINGS: Lower chest: No acute abnormality.

Hepatobiliary: No focal liver abnormality is seen. No gallstones,
gallbladder wall thickening, or biliary dilatation.

Pancreas: Unremarkable. No pancreatic ductal dilatation or
surrounding inflammatory changes.

Spleen: Normal in size without focal abnormality.

Adrenals/Urinary Tract: Adrenal glands are unremarkable. Kidneys are
normal, without renal calculi, focal lesion, or hydronephrosis.
Bladder is unremarkable.

Stomach/Bowel: The stomach appears normal. There is no evidence of
bowel obstruction. The appendix is enlarged with surrounding
inflammation consistent with acute appendicitis.

Appendix: Location: Right lower quadrant.

Diameter: 13 mm.

Appendicolith: Yes.

Mucosal hyper-enhancement: Yes.

Extraluminal gas: No.

Periappendiceal collection: No.

Vascular/Lymphatic: No significant vascular findings are present. No
enlarged abdominal or pelvic lymph nodes.

Reproductive: Intrauterine device is noted. No adnexal abnormality
is noted.

Other: No abdominal wall hernia or abnormality. No abdominopelvic
ascites.

Musculoskeletal: No acute or significant osseous findings.
IMPRESSION: Findings consistent with acute appendicitis. No abscess is noted.

## 2022-05-13 ENCOUNTER — Other Ambulatory Visit: Payer: Self-pay | Admitting: Family Medicine

## 2022-06-23 ENCOUNTER — Encounter: Payer: Self-pay | Admitting: *Deleted

## 2022-06-23 ENCOUNTER — Ambulatory Visit
Admission: EM | Admit: 2022-06-23 | Discharge: 2022-06-23 | Disposition: A | Discharge status: Home / Self Care | Payer: Medicaid Other | Attending: Family Medicine | Admitting: Family Medicine

## 2022-06-23 DIAGNOSIS — J069 Acute upper respiratory infection, unspecified: Secondary | ICD-10-CM | POA: Insufficient documentation

## 2022-06-23 DIAGNOSIS — J029 Acute pharyngitis, unspecified: Secondary | ICD-10-CM | POA: Diagnosis not present

## 2022-06-23 DIAGNOSIS — Z79899 Other long term (current) drug therapy: Secondary | ICD-10-CM | POA: Insufficient documentation

## 2022-06-23 DIAGNOSIS — Z1152 Encounter for screening for COVID-19: Secondary | ICD-10-CM | POA: Diagnosis not present

## 2022-06-23 LAB — RESP PANEL BY RT-PCR (FLU A&B, COVID) ARPGX2
Influenza A by PCR: NEGATIVE
Influenza B by PCR: NEGATIVE
SARS Coronavirus 2 by RT PCR: NEGATIVE

## 2022-06-23 LAB — POCT RAPID STREP A (OFFICE): Rapid Strep A Screen: NEGATIVE

## 2022-06-23 NOTE — ED Triage Notes (Signed)
Pt started with sore throat yesterday, congestion, headache and sneezing a lot. She has been taking allegra. She would like to be tested for strep.

## 2022-06-23 NOTE — ED Provider Notes (Signed)
[facility]    CSN: [id number] Arrival date & time: 06/23/22  4098      History   Chief Complaint Chief Complaint  Patient presents with   Sore Throat    HPI Janet Love is a 31 y.o. female.   Patient presenting today with 1 day history of sore throat, nasal congestion, sneezing, headache, fatigue.  Denies chest pain, shortness of breath, fever, chills, body aches, abdominal pain, nausea vomiting or diarrhea.  Taking Allegra with minimal relief.  Requesting testing for strep throat.  No known sick contacts recently.  No pertinent chronic medical problems per patient.    Past Medical History:  Diagnosis Date   Acute appendicitis 12/03/2020   Anxiety    Phreesia 04/26/2020   Appendicitis, acute 12/03/2020   Hx of varicella    Sore throat 09/17/2020   Upper respiratory tract infection 09/17/2020    Patient Active Problem List   Diagnosis Date Noted   S/P laparoscopic appendectomy 12/10/2020   At risk for fertility problems 09/17/2020   IUD (intrauterine device) in place 11/13/2014    Past Surgical History:  Procedure Laterality Date   LAPAROSCOPIC APPENDECTOMY N/A 12/04/2020   Procedure: APPENDECTOMY LAPAROSCOPIC;  Surgeon: Clovis Riley, MD;  Location: [facility];  Service: General;  Laterality: N/A;   NO PAST SURGERIES      OB History     Gravida  2   Para  2   Term  2   Preterm  0   AB  0   Living  2      SAB  0   IAB  0   Ectopic  0   Multiple  0   Live Births  2            Home Medications    Prior to Admission medications   Medication Sig Start Date End Date Taking? Authorizing Provider  fexofenadine (ALLEGRA) 180 MG tablet Take 180 mg by mouth daily.   Yes [provider]  levonorgestrel (MIRENA, 52 MG,) 20 MCG/24HR IUD 1 each by Intrauterine route once. 2018   Yes [provider]  venlafaxine XR (EFFEXOR-XR) 75 MG 24 hr capsule TAKE 1 CAPSULE(75 MG) BY MOUTH DAILY WITH BREAKFAST 05/13/22  Yes  Susy Frizzle, MD  traMADol (ULTRAM) 50 MG tablet Take 1 tablet (50 mg total) by mouth every 6 (six) hours as needed for severe pain. Patient not taking: Reported on 01/17/2022 12/05/20   Wellington Hampshire, PA-C    Family History Family History  Problem Relation Age of Onset   Hypertension Mother    Hypertension Father    Hypertension Brother    Cancer Maternal Grandmother        non-hodgkins lymphoma   Cancer Paternal Grandmother        cervical   Diabetes Paternal Grandmother    Other Neg Hx     Social History Social History   Tobacco Use   Smoking status: Former    Types: Cigarettes    Quit date: 09/28/2008    Years since quitting: 13.7   Smokeless tobacco: Never  Vaping Use   Vaping Use: Never used  Substance Use Topics   Alcohol use: No   Drug use: No     Allergies   Patient has no known allergies.   Review of Systems Review of Systems Per HPI  Physical Exam Triage Vital Signs ED Triage Vitals  Enc Vitals Group     BP 06/23/22 0829 131/89  Pulse Rate 06/23/22 0829 94     Resp 06/23/22 0829 18     Temp 06/23/22 0829 99 F (37.2 C)     Temp Source 06/23/22 0829 Oral     SpO2 06/23/22 0829 97 %     Weight --      Height --      Head Circumference --      Peak Flow --      Pain Score 06/23/22 0828 6     Pain Loc --      Pain Edu? --      Excl. in GC? --    No data found.  Updated Vital Signs BP 131/89 (BP Location: Right Arm)   Pulse 94   Temp 99 F (37.2 C) (Oral)   Resp 18   SpO2 97%   Visual Acuity Right Eye Distance:   Left Eye Distance:   Bilateral Distance:    Right Eye Near:   Left Eye Near:    Bilateral Near:     Physical Exam Vitals and nursing note reviewed.  Constitutional:      Appearance: Normal appearance.  HENT:     Head: Atraumatic.     Right Ear: Tympanic membrane and external ear normal.     Left Ear: Tympanic membrane and external ear normal.     Nose: Rhinorrhea present.     Mouth/Throat:     Mouth:  Mucous membranes are moist.     Pharynx: Posterior oropharyngeal erythema present.  Eyes:     Extraocular Movements: Extraocular movements intact.     Conjunctiva/sclera: Conjunctivae normal.  Cardiovascular:     Rate and Rhythm: Normal rate and regular rhythm.     Heart sounds: Normal heart sounds.  Pulmonary:     Effort: Pulmonary effort is normal.     Breath sounds: Normal breath sounds. No wheezing or rales.  Musculoskeletal:        General: Normal range of motion.     Cervical back: Normal range of motion and neck supple.  Skin:    General: Skin is warm and dry.  Neurological:     Mental Status: She is alert and oriented to person, place, and time.  Psychiatric:        Mood and Affect: Mood normal.        Thought Content: Thought content normal.      UC Treatments / Results  Labs (all labs ordered are listed, but only abnormal results are displayed) Labs Reviewed  POCT RAPID STREP A (OFFICE) - Normal  CULTURE, GROUP A STREP ([facility])  RESP PANEL BY RT-PCR (FLU A&B, COVID) [id number]    EKG   Radiology No results found.  Procedures Procedures (including critical care time)  Medications Ordered in UC Medications - No data to display  Initial Impression / Assessment and Plan / UC Course  I have reviewed the triage vital signs and the nursing notes.  Pertinent labs & imaging results that were available during my care of the patient were reviewed by me and considered in my medical decision making (see chart for details).     Vitals and exam reassuring and suggestive of a viral upper respiratory infection.  Rapid strep negative, throat culture and respiratory panel pending.  Discussed supportive over-the-counter medications, home care.  Adjust if needed based on results.  Work note given.  Final Clinical Impressions(s) / UC Diagnoses   Final diagnoses:  Viral URI   Discharge Instructions   None    ED  Prescriptions   None    PDMP not reviewed this  encounter.   Roosvelt Maser [place], [place] 06/23/22 [phone]

## 2022-06-26 LAB — CULTURE, GROUP A STREP (THRC)

## 2022-08-19 ENCOUNTER — Other Ambulatory Visit: Payer: Self-pay | Admitting: Family Medicine

## 2022-09-26 ENCOUNTER — Other Ambulatory Visit: Payer: Self-pay | Admitting: Family Medicine

## 2022-10-17 ENCOUNTER — Telehealth: Payer: Self-pay

## 2022-10-17 NOTE — Telephone Encounter (Signed)
Pt states she is having issues with her ear being stopped up x 2 days. Pt states she is getting over a cold and has been using Dayquil/Nyquil. Pt states the cold sx are better now it is just her ears, any suggestions? Thank you.

## 2022-10-21 ENCOUNTER — Ambulatory Visit: Payer: Medicaid Other | Admitting: Family Medicine

## 2022-10-21 ENCOUNTER — Encounter: Payer: Self-pay | Admitting: Family Medicine

## 2022-10-21 VITALS — BP 120/90 | HR 83 | Temp 97.6°F | Ht 62.0 in | Wt 194.0 lb

## 2022-10-21 DIAGNOSIS — J019 Acute sinusitis, unspecified: Secondary | ICD-10-CM | POA: Diagnosis not present

## 2022-10-21 MED ORDER — AMOXICILLIN-POT CLAVULANATE 875-125 MG PO TABS: 1.0000 | ORAL_TABLET | Freq: Two times a day (BID) | ORAL | 0 refills | Status: DC

## 2022-10-21 NOTE — Assessment & Plan Note (Signed)
In the setting of patients recent viral URI and ongoing symptoms including right ear pain, sinus pressure, and headache I will cover for bacterial sinusitis with Augmentin 875-136m BID for 10 days. She did c/o some right facial "numbness" but I am reassured by her normal neuro examination, cranial nerves are intact, facial sensation was normal on exam and no deficits were noted. Careful instructions were given to proceed to ED if she begins experiencing unilateral weakness, difficulty speaking, confusion, difficulty walking, or severe unrelenting thunderclap headache. May continue flonase, sudafed, and tylenol for pain. Return to office if symptoms persist or worsen.

## 2022-10-21 NOTE — Progress Notes (Signed)
Acute Office Visit  Subjective:     Patient ID: Janet Love, female    DOB: 1991-06-28, 32 y.o.   MRN: [id number]  Chief Complaint  Patient presents with   Acute Visit    R ear clogged; pressure in head,started wed. Night, started to feel numbness on the r-face and having really bad headaches.     HPI Patient is in today for right ear pressure and an echo of sound since Wednesday, she reports facial and ear numbness, dizziness, and headaches since Sunday. She describes her headaches as right facial pressure and pain on her forehead, 8/10, relieved by nothing. She did have a viral illness just prior to that, suspected flu. Has tried Flonase and Sudafed over the weekend. Denies fevers, vision changes, confusion/AMS, N/V/D, cough, congestion.  Review of Systems  Neurological:  Positive for dizziness, sensory change and headaches. Negative for speech change, focal weakness and weakness.  All other systems reviewed and are negative.   Past Medical History:  Diagnosis Date   Acute appendicitis 12/03/2020   Anxiety    Phreesia 04/26/2020   Appendicitis, acute 12/03/2020   Hx of varicella    Sore throat 09/17/2020   Upper respiratory tract infection 09/17/2020   Past Surgical History:  Procedure Laterality Date   LAPAROSCOPIC APPENDECTOMY N/A 12/04/2020   Procedure: APPENDECTOMY LAPAROSCOPIC;  Surgeon: Clovis Riley, MD;  Location: [facility];  Service: General;  Laterality: N/A;   NO PAST SURGERIES     Current Outpatient Medications on File Prior to Visit  Medication Sig Dispense Refill   levonorgestrel (MIRENA, 52 MG,) 20 MCG/24HR IUD 1 each by Intrauterine route once. 2018     venlafaxine XR (EFFEXOR-XR) 75 MG 24 hr capsule TAKE 1 CAPSULE(75 MG) BY MOUTH DAILY WITH BREAKFAST 30 capsule 0   fexofenadine (ALLEGRA) 180 MG tablet Take 180 mg by mouth daily. (Patient not taking: Reported on 10/21/2022)     traMADol (ULTRAM) 50 MG tablet Take 1 tablet (50 mg total) by mouth every 6  (six) hours as needed for severe pain. (Patient not taking: Reported on 10/21/2022) 15 tablet 0   No current facility-administered medications on file prior to visit.   No Known Allergies     Objective:    BP (!) 120/90   Pulse 83   Temp 97.6 F (36.4 C) (Oral)   Ht 5' 2"$  (1.575 m)   Wt 194 lb (88 kg)   SpO2 97%   BMI 35.48 kg/m    Physical Exam Vitals and nursing note reviewed.  Constitutional:      Appearance: Normal appearance. She is normal weight.  HENT:     Head: Normocephalic and atraumatic.     Right Ear: There is impacted cerumen.     Left Ear: There is impacted cerumen.     Nose: Nose normal.     Mouth/Throat:     Mouth: Mucous membranes are moist.     Pharynx: Oropharynx is clear.  Eyes:     Extraocular Movements: Extraocular movements intact.     Conjunctiva/sclera: Conjunctivae normal.     Pupils: Pupils are equal, round, and reactive to light.  Cardiovascular:     Rate and Rhythm: Normal rate and regular rhythm.     Pulses: Normal pulses.     Heart sounds: Normal heart sounds.  Pulmonary:     Effort: Pulmonary effort is normal.     Breath sounds: Normal breath sounds.  Skin:    General: Skin is warm and  dry.  Neurological:     General: No focal deficit present.     Mental Status: She is alert and oriented to person, place, and time. Mental status is at baseline.     GCS: GCS eye subscore is 4. GCS verbal subscore is 5. GCS motor subscore is 6.     Cranial Nerves: Cranial nerves 2-12 are intact.     Sensory: Sensation is intact.     Motor: Motor function is intact.     Coordination: Coordination is intact.     Gait: Gait is intact.  Psychiatric:        Mood and Affect: Mood normal.        Behavior: Behavior normal.        Thought Content: Thought content normal.        Judgment: Judgment normal.     No results found for any visits on 10/21/22.      Assessment & Plan:   Problem List Items Addressed This Visit       Respiratory   Acute  non-recurrent sinusitis - Primary    In the setting of patients recent viral URI and ongoing symptoms including right ear pain, sinus pressure, and headache I will cover for bacterial sinusitis with Augmentin 875-12m BID for 10 days. She did c/o some right facial "numbness" but I am reassured by her normal neuro examination, cranial nerves are intact, facial sensation was normal on exam and no deficits were noted. Careful instructions were given to proceed to ED if she begins experiencing unilateral weakness, difficulty speaking, confusion, difficulty walking, or severe unrelenting thunderclap headache. May continue flonase, sudafed, and tylenol for pain. Return to office if symptoms persist or worsen.      Relevant Medications   amoxicillin-clavulanate (AUGMENTIN) 875-125 MG tablet    Meds ordered this encounter  Medications   amoxicillin-clavulanate (AUGMENTIN) 875-125 MG tablet    Sig: Take 1 tablet by mouth 2 (two) times daily.    Dispense:  20 tablet    Refill:  0    Order Specific Question:   Supervising Provider    Answer:   PJenna LuoT [[id number]   Return if symptoms worsen or fail to improve.  ARubie Maid FNP

## 2022-11-26 ENCOUNTER — Other Ambulatory Visit: Payer: Self-pay | Admitting: Family Medicine

## 2023-01-26 ENCOUNTER — Ambulatory Visit (INDEPENDENT_AMBULATORY_CARE_PROVIDER_SITE_OTHER): Payer: Medicaid Other | Admitting: Obstetrics & Gynecology

## 2023-01-26 ENCOUNTER — Other Ambulatory Visit (HOSPITAL_COMMUNITY)
Admission: RE | Admit: 2023-01-26 | Discharge: 2023-01-26 | Disposition: A | Discharge status: Home / Self Care | Payer: Medicaid Other | Source: Ambulatory Visit | Attending: Obstetrics & Gynecology | Admitting: Obstetrics & Gynecology

## 2023-01-26 ENCOUNTER — Encounter: Payer: Self-pay | Admitting: Obstetrics & Gynecology

## 2023-01-26 VITALS — BP 128/84 | HR 71 | Ht 62.0 in | Wt 201.0 lb

## 2023-01-26 DIAGNOSIS — Z01419 Encounter for gynecological examination (general) (routine) without abnormal findings: Secondary | ICD-10-CM

## 2023-01-26 NOTE — Progress Notes (Signed)
Subjective:     Janet Love is a 32 y.o. female here for a routine exam.  No LMP recorded. (Menstrual status: IUD). [id number] Birth Control Method:  Mirena IUD Menstrual Calendar(currently): has some irregular spotting  Current complaints: none.   Current acute medical issues:     Recent Gynecologic History No LMP recorded. (Menstrual status: IUD). Last Pap: years ago,  normal Last mammogram: ,    Past Medical History:  Diagnosis Date   Acute appendicitis 12/03/2020   Anxiety    Phreesia 04/26/2020   Appendicitis, acute 12/03/2020   Hx of varicella    Sore throat 09/17/2020   Upper respiratory tract infection 09/17/2020    Past Surgical History:  Procedure Laterality Date   LAPAROSCOPIC APPENDECTOMY N/A 12/04/2020   Procedure: APPENDECTOMY LAPAROSCOPIC;  Surgeon: Berna Bue, MD;  Location: [facility];  Service: General;  Laterality: N/A;   NO PAST SURGERIES      OB History     Gravida  2   Para  2   Term  2   Preterm  0   AB  0   Living  2      SAB  0   IAB  0   Ectopic  0   Multiple  0   Live Births  2           Social History   Socioeconomic History   Marital status: Married    Spouse name: Not on file   Number of children: Not on file   Years of education: Not on file   Highest education level: Not on file  Occupational History   Not on file  Tobacco Use   Smoking status: Former    Types: Cigarettes    Quit date: 09/28/2008    Years since quitting: 14.3   Smokeless tobacco: Never  Vaping Use   Vaping Use: Never used  Substance and Sexual Activity   Alcohol use: No   Drug use: No   Sexual activity: Yes    Birth control/protection: I.U.D.  Other Topics Concern   Not on file  Social History Narrative   Not on file   Social Determinants of Health   Financial Resource Strain: Low Risk  (01/26/2023)   Overall Financial Resource Strain (CARDIA)    Difficulty of Paying Living Expenses: Not hard at all  Food Insecurity: No Food  Insecurity (01/26/2023)   Hunger Vital Sign    Worried About Running Out of Food in the Last Year: Never true    Ran Out of Food in the Last Year: Never true  Transportation Needs: No Transportation Needs (01/26/2023)   PRAPARE - Administrator, Civil Service (Medical): No    Lack of Transportation (Non-Medical): No  Physical Activity: Insufficiently Active (01/26/2023)   Exercise Vital Sign    Days of Exercise per Week: 3 days    Minutes of Exercise per Session: 30 min  Stress: Stress Concern Present (01/26/2023)   [employer] of Occupational Health - Occupational Stress Questionnaire    Feeling of Stress : To some extent  Social Connections: Moderately Integrated (01/26/2023)   Social Connection and Isolation Panel [NHANES]    Frequency of Communication with Friends and Family: More than three times a week    Frequency of Social Gatherings with Friends and Family: Twice a week    Attends Religious Services: 1 to 4 times per year    Active Member of Clubs or Organizations: No  Attends Banker Meetings: Never    Marital Status: Married    Family History  Problem Relation Age of Onset   Hypertension Mother    Hypertension Father    Hypertension Brother    Cancer Maternal Grandmother        non-hodgkins lymphoma   Cancer Paternal Grandmother        cervical   Diabetes Paternal Grandmother    Other Neg Hx      Current Outpatient Medications:    fexofenadine (ALLEGRA) 180 MG tablet, Take 180 mg by mouth daily., Disp: , Rfl:    levonorgestrel (MIRENA, 52 MG,) 20 MCG/24HR IUD, 1 each by Intrauterine route once. 2018, Disp: , Rfl:    venlafaxine XR (EFFEXOR-XR) 75 MG 24 hr capsule, TAKE 1 CAPSULE(75 MG) BY MOUTH DAILY WITH BREAKFAST, Disp: 30 capsule, Rfl: 0  Review of Systems  Review of Systems  Constitutional: Negative for fever, chills, weight loss, malaise/fatigue and diaphoresis.  HENT: Negative for hearing loss, ear pain, nosebleeds,  congestion, sore throat, neck pain, tinnitus and ear discharge.   Eyes: Negative for blurred vision, double vision, photophobia, pain, discharge and redness.  Respiratory: Negative for cough, hemoptysis, sputum production, shortness of breath, wheezing and stridor.   Cardiovascular: Negative for chest pain, palpitations, orthopnea, claudication, leg swelling and PND.  Gastrointestinal: negative for abdominal pain. Negative for heartburn, nausea, vomiting, diarrhea, constipation, blood in stool and melena.  Genitourinary: Negative for dysuria, urgency, frequency, hematuria and flank pain.  Musculoskeletal: Negative for myalgias, back pain, joint pain and falls.  Skin: Negative for itching and rash.  Neurological: Negative for dizziness, tingling, tremors, sensory change, speech change, focal weakness, seizures, loss of consciousness, weakness and headaches.  Endo/Heme/Allergies: Negative for environmental allergies and polydipsia. Does not bruise/bleed easily.  Psychiatric/Behavioral: Negative for depression, suicidal ideas, hallucinations, memory loss and substance abuse. The patient is not nervous/anxious and does not have insomnia.        Objective:  Blood pressure 128/84, pulse 71, height 5\' 2"  (1.575 m), weight 201 lb (91.2 kg), unknown if currently breastfeeding.   Physical Exam  Vitals reviewed. Constitutional: She is oriented to person, place, and time. She appears well-developed and well-nourished.  HENT:  Head: Normocephalic and atraumatic.        Right Ear: External ear normal.  Left Ear: External ear normal.  Nose: Nose normal.  Mouth/Throat: Oropharynx is clear and moist.  Eyes: Conjunctivae and EOM are normal. Pupils are equal, round, and reactive to light. Right eye exhibits no discharge. Left eye exhibits no discharge. No scleral icterus.  Neck: Normal range of motion. Neck supple. No tracheal deviation present. No thyromegaly present.  Cardiovascular: Normal rate, regular  rhythm, normal heart sounds and intact distal pulses.  Exam reveals no gallop and no friction rub.   No murmur heard. Respiratory: Effort normal and breath sounds normal. No respiratory distress. She has no wheezes. She has no rales. She exhibits no tenderness.  GI: Soft. Bowel sounds are normal. She exhibits no distension and no mass. There is no tenderness. There is no rebound and no guarding.  Genitourinary:  Breasts no masses skin changes or nipple changes bilaterally      Vulva is normal without lesions Vagina is pink moist without discharge Cervix normal in appearance and pap is done, IUD strings are visible Uterus is normal size shape and contour Adnexa is negative with normal sized ovaries   Musculoskeletal: Normal range of motion. She exhibits no edema and no tenderness.  Neurological: She is alert and oriented to person, place, and time. She has normal reflexes. She displays normal reflexes. No cranial nerve deficit. She exhibits normal muscle tone. Coordination normal.  Skin: Skin is warm and dry. No rash noted. No erythema. No pallor.  Psychiatric: She has a normal mood and affect. Her behavior is normal. Judgment and thought content normal.       Medications Ordered at today's visit: No orders of the defined types were placed in this encounter.   Other orders placed at today's visit: No orders of the defined types were placed in this encounter.     Assessment:    Normal Gyn exam.   Spotting on Mirena Plan:    Contraception: IUD. Follow up in: prn weeks.     Return if symptoms worsen or fail to improve, for pt will decide when she wants to schedule her IUD removal and replacment, anytime is ok.

## 2023-01-28 LAB — CYTOLOGY - PAP
Adequacy: ABSENT
Chlamydia: NEGATIVE
Comment: NEGATIVE
Comment: NEGATIVE
Comment: NORMAL
Diagnosis: NEGATIVE
High risk HPV: NEGATIVE
Neisseria Gonorrhea: NEGATIVE

## 2023-02-24 ENCOUNTER — Ambulatory Visit: Payer: Medicaid Other | Admitting: Obstetrics & Gynecology

## 2023-02-24 ENCOUNTER — Encounter: Payer: Self-pay | Admitting: Obstetrics & Gynecology

## 2023-02-24 VITALS — BP 130/92 | HR 86 | Ht 62.0 in | Wt 202.0 lb

## 2023-02-24 DIAGNOSIS — Z30433 Encounter for removal and reinsertion of intrauterine contraceptive device: Secondary | ICD-10-CM | POA: Diagnosis not present

## 2023-02-24 MED ORDER — LEVONORGESTREL 20 MCG/DAY IU IUD
1.0000 | INTRAUTERINE_SYSTEM | Freq: Once | INTRAUTERINE | Status: AC
  Administered 2023-02-24: 1 via INTRAUTERINE

## 2023-02-24 NOTE — Progress Notes (Signed)
IUD Removal + ReInsertion Procedure Note  Pre-operative Diagnosis: DUB with IUD been in for 6 years, will remove to improve amenorrhea as patient also wants to continue to use it for contraception  Post-operative Diagnosis: same  Indications: contraception  Procedure Details  Urine pregnancy test was not done.  The risks (including infection, bleeding, pain, and uterine perforation) and benefits of the procedure were explained to the patient and Written informed consent was obtained.    Strings visible and IUD removed without difficulty  Cervix cleansed with Betadine. Uterus sounded to 8 cm. IUD inserted without difficulty. String visible and trimmed. Patient tolerated procedure well.  IUD Information: Mirena, Lot # [id number], Expiration date 03/2025.  Condition: Stable  Complications: None  Plan:  The patient was advised to call for any fever or for prolonged or severe pain or bleeding. She was advised to use OTC ibuprofen as needed for mild to moderate pain.   Attending Physician Documentation: I performed the procedures

## 2023-02-24 NOTE — Addendum Note (Signed)
Addended by: Annamarie Dawley on: 02/24/2023 03:05 PM   Modules accepted: Orders

## 2023-04-23 ENCOUNTER — Ambulatory Visit: Payer: Medicaid Other | Admitting: Family Medicine

## 2023-12-26 ENCOUNTER — Ambulatory Visit
Admission: EM | Admit: 2023-12-26 | Discharge: 2023-12-26 | Disposition: A | Discharge status: Home / Self Care | Payer: Self-pay | Attending: Family Medicine | Admitting: Family Medicine

## 2023-12-26 ENCOUNTER — Encounter: Payer: Self-pay | Admitting: Emergency Medicine

## 2023-12-26 ENCOUNTER — Other Ambulatory Visit: Payer: Self-pay

## 2023-12-26 DIAGNOSIS — J22 Unspecified acute lower respiratory infection: Secondary | ICD-10-CM

## 2023-12-26 MED ORDER — PREDNISONE 20 MG PO TABS: 40.0000 mg | ORAL_TABLET | Freq: Every day | ORAL | 0 refills | Status: AC

## 2023-12-26 MED ORDER — AMOXICILLIN-POT CLAVULANATE 875-125 MG PO TABS: 1.0000 | ORAL_TABLET | Freq: Two times a day (BID) | ORAL | 0 refills | Status: AC

## 2023-12-26 MED ORDER — ALBUTEROL SULFATE HFA 108 (90 BASE) MCG/ACT IN AERS
2.0000 | INHALATION_SPRAY | RESPIRATORY_TRACT | 0 refills | Status: AC | PRN
Start: 2023-12-26 — End: ?

## 2023-12-26 NOTE — ED Triage Notes (Signed)
 Pt reports cough intermittently for last several weeks. Pt reports returned with increased frequency and production since Wednesday.   Denies any known fevers.

## 2023-12-29 NOTE — ED Provider Notes (Signed)
 [facility]    CSN: [id number] Arrival date & time: 12/26/23  1478      History   Chief Complaint Chief Complaint  Patient presents with   Cough    HPI Janet Love is a 33 y.o. female.   Patient presenting today with several week history of intermittent cough now progressively worsening and productive, congestion, fatigue.  Denies fevers, chest pain, shortness of breath, abdominal pain, vomiting, diarrhea.  So far trying over-the-counter remedies with minimal relief.  No known history of chronic pulmonary disease.    Past Medical History:  Diagnosis Date   Acute appendicitis 12/03/2020   Anxiety    [employer] 04/26/2020   Appendicitis, acute 12/03/2020   Hx of varicella    Sore throat 09/17/2020   Upper respiratory tract infection 09/17/2020    Patient Active Problem List   Diagnosis Date Noted   Acute non-recurrent sinusitis 10/21/2022   S/P laparoscopic appendectomy 12/10/2020   At risk for fertility problems 09/17/2020   IUD (intrauterine device) in place 11/13/2014    Past Surgical History:  Procedure Laterality Date   LAPAROSCOPIC APPENDECTOMY N/A 12/04/2020   Procedure: APPENDECTOMY LAPAROSCOPIC;  Surgeon: Adalberto Acton, MD;  Location: [facility];  Service: General;  Laterality: N/A;   NO PAST SURGERIES      OB History     Gravida  2   Para  2   Term  2   Preterm  0   AB  0   Living  2      SAB  0   IAB  0   Ectopic  0   Multiple  0   Live Births  2            Home Medications    Prior to Admission medications   Medication Sig Start Date End Date Taking? Authorizing Provider  albuterol  (VENTOLIN  HFA) 108 (90 Base) MCG/ACT inhaler Inhale 2 puffs into the lungs every 4 (four) hours as needed. 12/26/23  Yes Corbin Dess, PA-C  amoxicillin -clavulanate (AUGMENTIN ) 875-125 MG tablet Take 1 tablet by mouth every 12 (twelve) hours. 12/26/23  Yes Corbin Dess, PA-C  predniSONE  (DELTASONE ) 20 MG tablet Take  2 tablets (40 mg total) by mouth daily with breakfast. 12/26/23  Yes Corbin Dess, PA-C  fexofenadine (ALLEGRA) 180 MG tablet Take 180 mg by mouth daily.    [provider]  levonorgestrel  (MIRENA , 52 MG,) 20 MCG/24HR IUD 1 each by Intrauterine route once. 2018    [provider]  venlafaxine  XR (EFFEXOR -XR) 75 MG 24 hr capsule TAKE 1 CAPSULE(75 MG) BY MOUTH DAILY WITH BREAKFAST 11/26/22   Austine Lefort, MD    Family History Family History  Problem Relation Age of Onset   Hypertension Mother    Hypertension Father    Hypertension Brother    Cancer Maternal Grandmother        non-hodgkins lymphoma   Cancer Paternal Grandmother        cervical   Diabetes Paternal Grandmother    Other Neg Hx     Social History Social History   Tobacco Use   Smoking status: Former    Current packs/day: 0.00    Types: Cigarettes    Quit date: 09/28/2008    Years since quitting: 15.2   Smokeless tobacco: Never  Vaping Use   Vaping status: Never Used  Substance Use Topics   Alcohol use: No   Drug use: No     Allergies  Patient has no known allergies.   Review of Systems Review of Systems Per HPI  Physical Exam Triage Vital Signs ED Triage Vitals  Encounter Vitals Group     BP 12/26/23 0823 (!) 142/103     Systolic BP Percentile --      Diastolic BP Percentile --      Pulse Rate 12/26/23 0823 98     Resp 12/26/23 0823 20     Temp 12/26/23 0823 98.4 F (36.9 C)     Temp Source 12/26/23 0823 Oral     SpO2 12/26/23 0823 93 %     Weight --      Height --      Head Circumference --      Peak Flow --      Pain Score 12/26/23 0822 6     Pain Loc --      Pain Education --      Exclude from Growth Chart --    No data found.  Updated Vital Signs BP (!) 142/103 (BP Location: Right Arm) Comment: pt reports hx of similar .is not currently taking bp meds. x2 attempt with 2 different cuffs.  Pulse 98   Temp 98.4 F (36.9 C) (Oral)   Resp 20   SpO2 93%    Visual Acuity Right Eye Distance:   Left Eye Distance:   Bilateral Distance:    Right Eye Near:   Left Eye Near:    Bilateral Near:     Physical Exam Vitals and nursing note reviewed.  Constitutional:      Appearance: Normal appearance.  HENT:     Head: Atraumatic.     Right Ear: Tympanic membrane and external ear normal.     Left Ear: Tympanic membrane and external ear normal.     Nose: Congestion present.     Mouth/Throat:     Mouth: Mucous membranes are moist.     Pharynx: Posterior oropharyngeal erythema present.  Eyes:     Extraocular Movements: Extraocular movements intact.     Conjunctiva/sclera: Conjunctivae normal.  Cardiovascular:     Rate and Rhythm: Normal rate and regular rhythm.     Heart sounds: Normal heart sounds.  Pulmonary:     Effort: Pulmonary effort is normal.     Breath sounds: Wheezing present. No rales.  Musculoskeletal:        General: Normal range of motion.     Cervical back: Normal range of motion and neck supple.  Skin:    General: Skin is warm and dry.  Neurological:     Mental Status: She is alert and oriented to person, place, and time.  Psychiatric:        Mood and Affect: Mood normal.        Thought Content: Thought content normal.      UC Treatments / Results  Labs (all labs ordered are listed, but only abnormal results are displayed) Labs Reviewed - No data to display  EKG   Radiology No results found.  Procedures Procedures (including critical care time)  Medications Ordered in UC Medications - No data to display  Initial Impression / Assessment and Plan / UC Course  I have reviewed the triage vital signs and the nursing notes.  Pertinent labs & imaging results that were available during my care of the patient were reviewed by me and considered in my medical decision making (see chart for details).     Given duration worsening course, treat with Augmentin , prednisone , albuterol , supportive  over-the-counter  medications and home care.  Return for worsening symptoms.  Final Clinical Impressions(s) / UC Diagnoses   Final diagnoses:  Lower respiratory infection   Discharge Instructions   None    ED Prescriptions     Medication Sig Dispense Auth. Provider   amoxicillin -clavulanate (AUGMENTIN ) 875-125 MG tablet Take 1 tablet by mouth every 12 (twelve) hours. 14 tablet Corbin Dess, PA-C   predniSONE  (DELTASONE ) 20 MG tablet Take 2 tablets (40 mg total) by mouth daily with breakfast. 10 tablet Corbin Dess, PA-C   albuterol  (VENTOLIN  HFA) 108 (90 Base) MCG/ACT inhaler Inhale 2 puffs into the lungs every 4 (four) hours as needed. 18 g Corbin Dess, [place]      PDMP not reviewed this encounter.   Corbin Dess, [place] 12/29/23 1815
